# Patient Record
Sex: Female | Born: 1980 | Race: White | Hispanic: No | Marital: Married | Smoking: Never smoker
Health system: Southern US, Community
[De-identification: ages and names within clinical notes are randomized; demographics above are authoritative.]

## PROBLEM LIST (undated history)

## (undated) ENCOUNTER — Inpatient Hospital Stay (HOSPITAL_COMMUNITY): Payer: Self-pay

## (undated) DIAGNOSIS — I83811 Varicose veins of right lower extremities with pain: Secondary | ICD-10-CM

## (undated) HISTORY — DX: Varicose veins of right lower extremity with pain: I83.811

---

## 2016-11-08 ENCOUNTER — Encounter: Payer: Self-pay | Admitting: Internal Medicine

## 2016-11-08 ENCOUNTER — Ambulatory Visit (INDEPENDENT_AMBULATORY_CARE_PROVIDER_SITE_OTHER): Payer: Managed Care, Other (non HMO) | Admitting: Internal Medicine

## 2016-11-08 ENCOUNTER — Other Ambulatory Visit (INDEPENDENT_AMBULATORY_CARE_PROVIDER_SITE_OTHER): Payer: Managed Care, Other (non HMO)

## 2016-11-08 DIAGNOSIS — Z0001 Encounter for general adult medical examination with abnormal findings: Secondary | ICD-10-CM | POA: Diagnosis not present

## 2016-11-08 DIAGNOSIS — Z0000 Encounter for general adult medical examination without abnormal findings: Secondary | ICD-10-CM | POA: Insufficient documentation

## 2016-11-08 DIAGNOSIS — L853 Xerosis cutis: Secondary | ICD-10-CM | POA: Diagnosis not present

## 2016-11-08 LAB — URINALYSIS
Bilirubin Urine: NEGATIVE
Ketones, ur: NEGATIVE
Leukocytes, UA: NEGATIVE
Nitrite: NEGATIVE
SPECIFIC GRAVITY, URINE: 1.01 (ref 1.000–1.030)
Total Protein, Urine: NEGATIVE
UROBILINOGEN UA: 0.2 (ref 0.0–1.0)
Urine Glucose: NEGATIVE
pH: 8 (ref 5.0–8.0)

## 2016-11-08 LAB — HEPATIC FUNCTION PANEL
ALT: 13 U/L (ref 0–35)
AST: 13 U/L (ref 0–37)
Albumin: 4.5 g/dL (ref 3.5–5.2)
Alkaline Phosphatase: 28 U/L — ABNORMAL LOW (ref 39–117)
BILIRUBIN TOTAL: 0.6 mg/dL (ref 0.2–1.2)
Bilirubin, Direct: 0.1 mg/dL (ref 0.0–0.3)
TOTAL PROTEIN: 7.8 g/dL (ref 6.0–8.3)

## 2016-11-08 LAB — LIPID PANEL
CHOLESTEROL: 199 mg/dL (ref 0–200)
HDL: 53.5 mg/dL (ref >39.00)
LDL CALC: 128 mg/dL — AB (ref 0–99)
NonHDL: 145.15
TRIGLYCERIDES: 85 mg/dL (ref 0.0–149.0)
Total CHOL/HDL Ratio: 4
VLDL: 17 mg/dL (ref 0.0–40.0)

## 2016-11-08 LAB — BASIC METABOLIC PANEL WITH GFR
BUN: 9 mg/dL (ref 6–23)
CO2: 27 meq/L (ref 19–32)
Calcium: 9.4 mg/dL (ref 8.4–10.5)
Chloride: 106 meq/L (ref 96–112)
Creatinine, Ser: 0.6 mg/dL (ref 0.40–1.20)
GFR: 121.63 mL/min
Glucose, Bld: 98 mg/dL (ref 70–99)
Potassium: 4.3 meq/L (ref 3.5–5.1)
Sodium: 139 meq/L (ref 135–145)

## 2016-11-08 LAB — CBC WITH DIFFERENTIAL/PLATELET
BASOS ABS: 0 10*3/uL (ref 0.0–0.1)
Basophils Relative: 0.5 % (ref 0.0–3.0)
EOS PCT: 0.9 % (ref 0.0–5.0)
Eosinophils Absolute: 0.1 10*3/uL (ref 0.0–0.7)
HCT: 41.6 % (ref 36.0–46.0)
HEMOGLOBIN: 14.7 g/dL (ref 12.0–15.0)
LYMPHS ABS: 2 10*3/uL (ref 0.7–4.0)
Lymphocytes Relative: 29.8 % (ref 12.0–46.0)
MCHC: 35.3 g/dL (ref 30.0–36.0)
MCV: 84.4 fl (ref 78.0–100.0)
MONO ABS: 0.6 10*3/uL (ref 0.1–1.0)
MONOS PCT: 9.4 % (ref 3.0–12.0)
NEUTROS PCT: 59.4 % (ref 43.0–77.0)
Neutro Abs: 3.9 10*3/uL (ref 1.4–7.7)
Platelets: 244 10*3/uL (ref 150.0–400.0)
RBC: 4.92 Mil/uL (ref 3.87–5.11)
RDW: 12.7 % (ref 11.5–15.5)
WBC: 6.6 10*3/uL (ref 4.0–10.5)

## 2016-11-08 LAB — TSH: TSH: 1.76 u[IU]/mL (ref 0.35–4.50)

## 2016-11-08 MED ORDER — VITAMIN D 1000 UNITS PO TABS: 1000.0000 [IU] | ORAL_TABLET | Freq: Every day | ORAL | 3 refills | Status: AC

## 2016-11-08 MED ORDER — TRIAMCINOLONE ACETONIDE 0.5 % EX OINT: 1.0000 "application " | TOPICAL_OINTMENT | Freq: Two times a day (BID) | CUTANEOUS | 2 refills | Status: AC

## 2016-11-08 MED ORDER — FOLIC ACID 400 MCG PO TABS: 400.0000 ug | ORAL_TABLET | Freq: Every day | ORAL | 3 refills | Status: DC

## 2016-11-08 NOTE — Patient Instructions (Signed)
Sebamed 

## 2016-11-08 NOTE — Progress Notes (Signed)
   Subjective:  Patient ID: Sonya [name], female    DOB: [birth year]  Age: 36 y.o. MRN: [id number]  CC: Establish Care   HPI Sonya [name] presents for a well exam  No outpatient prescriptions prior to visit.   No facility-administered medications prior to visit.     ROS Review of Systems  Constitutional: Positive for unexpected weight change. Negative for activity change, appetite change, chills and fatigue.  HENT: Negative for congestion, mouth sores and sinus pressure.   Eyes: Negative for visual disturbance.  Respiratory: Negative for cough and chest tightness.   Gastrointestinal: Negative for abdominal pain and nausea.  Genitourinary: Negative for difficulty urinating, frequency and vaginal pain.  Musculoskeletal: Negative for back pain and gait problem.  Skin: Positive for rash. Negative for pallor.  Neurological: Negative for dizziness, tremors, weakness, numbness and headaches.  Psychiatric/Behavioral: Negative for confusion and sleep disturbance.  dry skin - fingers  Objective:  BP 110/76   Pulse 77   Ht 5\' 10"  (1.778 m)   Wt 173 lb (78.5 kg)   SpO2 97%   BMI 24.82 kg/m   BP Readings from Last 3 Encounters:  11/08/16 110/76    Wt Readings from Last 3 Encounters:  11/08/16 173 lb (78.5 kg)    Physical Exam  Constitutional: She appears well-developed. No distress.  HENT:  Head: Normocephalic.  Right Ear: External ear normal.  Left Ear: External ear normal.  Nose: Nose normal.  Mouth/Throat: Oropharynx is clear and moist.  Eyes: Conjunctivae are normal. Pupils are equal, round, and reactive to light. Right eye exhibits no discharge. Left eye exhibits no discharge.  Neck: Normal range of motion. Neck supple. No JVD present. No tracheal deviation present. No thyromegaly present.  Cardiovascular: Normal rate, regular rhythm and normal heart sounds.   Pulmonary/Chest: No stridor. No respiratory distress. She has no wheezes.  Abdominal: Soft. Bowel  sounds are normal. She exhibits no distension and no mass. There is no tenderness. There is no rebound and no guarding.  Musculoskeletal: She exhibits no edema or tenderness.  Lymphadenopathy:    She has no cervical adenopathy.  Neurological: She displays normal reflexes. No cranial nerve deficit. She exhibits normal muscle tone. Coordination normal.  Skin: Rash noted. No erythema.  Psychiatric: She has a normal mood and affect. Her behavior is normal. Judgment and thought content normal.  dry skin fingers  No results found for: WBC, HGB, HCT, PLT, GLUCOSE, CHOL, TRIG, HDL, LDLDIRECT, LDLCALC, ALT, AST, NA, K, CL, CREATININE, BUN, CO2, TSH, PSA, INR, GLUF, HGBA1C, MICROALBUR  Patient was never admitted.  Assessment & Plan:   There are no diagnoses linked to this encounter. [name] does not currently have medications on file.  No orders of the defined types were placed in this encounter.    Follow-up: No Follow-up on file.  Sonda PrimesAlex Plotnikov, MD

## 2016-11-08 NOTE — Progress Notes (Signed)
Pre visit review using our clinic review tool, if applicable. No additional management support is needed unless otherwise documented below in the visit note. 

## 2016-11-08 NOTE — Assessment & Plan Note (Addendum)
GYN - pending w/the fertility clinic Labs ordered XRT exposure from [place] (?degree) - they will research it Vit D, folic acid Low carb diet/exercise  We discussed age appropriate health related issues, including available/recomended screening tests and vaccinations. We discussed a need for adhering to healthy diet and exercise. Labs/EKG were reviewed/ordered. All questions were answered.

## 2016-11-08 NOTE — Assessment & Plan Note (Signed)
Wintertime - fingers Sebamed soap Kenalog prn

## 2016-11-09 ENCOUNTER — Encounter: Payer: Self-pay | Admitting: Internal Medicine

## 2016-11-16 ENCOUNTER — Encounter: Payer: Self-pay | Admitting: Internal Medicine

## 2016-11-19 ENCOUNTER — Other Ambulatory Visit: Payer: Self-pay | Admitting: Internal Medicine

## 2016-11-19 MED ORDER — OSELTAMIVIR PHOSPHATE 75 MG PO CAPS
75.0000 mg | ORAL_CAPSULE | Freq: Two times a day (BID) | ORAL | 0 refills | Status: DC
Start: 2016-11-19 — End: 2017-11-14

## 2017-10-15 NOTE — L&D Delivery Note (Signed)
Delivery Note At 1:50 AM a viable female was delivered via Vaginal, Spontaneous (Presentation:ROA).  APGAR: 8, 9; weight pending   Placenta status: complete. Cord: 3V with the following complications: cord avulsion with manual removal of detached placenta from upper vagina.  Cord pH: n/a  Anesthesia:  epidural Episiotomy: None Lacerations: Stellate 2nd degree vaginal  Suture Repair: 2.0 3.0 vicryl Est. Blood Loss (mL):  pending  Mom to postpartum.  Baby to Couplet care / Skin to Skin.  Sonya [name] 08/25/2018, 2:52 AM

## 2017-11-14 ENCOUNTER — Ambulatory Visit (INDEPENDENT_AMBULATORY_CARE_PROVIDER_SITE_OTHER): Payer: Managed Care, Other (non HMO) | Admitting: Internal Medicine

## 2017-11-14 ENCOUNTER — Encounter: Payer: Self-pay | Admitting: Internal Medicine

## 2017-11-14 ENCOUNTER — Other Ambulatory Visit (INDEPENDENT_AMBULATORY_CARE_PROVIDER_SITE_OTHER): Payer: Managed Care, Other (non HMO)

## 2017-11-14 VITALS — BP 112/64 | HR 72 | Temp 98.8°F | Ht 70.0 in | Wt 175.0 lb

## 2017-11-14 DIAGNOSIS — Z0000 Encounter for general adult medical examination without abnormal findings: Secondary | ICD-10-CM

## 2017-11-14 DIAGNOSIS — B351 Tinea unguium: Secondary | ICD-10-CM | POA: Insufficient documentation

## 2017-11-14 DIAGNOSIS — Z3169 Encounter for other general counseling and advice on procreation: Secondary | ICD-10-CM | POA: Insufficient documentation

## 2017-11-14 DIAGNOSIS — L853 Xerosis cutis: Secondary | ICD-10-CM

## 2017-11-14 LAB — URINALYSIS, ROUTINE W REFLEX MICROSCOPIC
BILIRUBIN URINE: NEGATIVE
Ketones, ur: NEGATIVE
LEUKOCYTES UA: NEGATIVE
Nitrite: NEGATIVE
PH: 7.5 (ref 5.0–8.0)
Specific Gravity, Urine: 1.02 (ref 1.000–1.030)
TOTAL PROTEIN, URINE-UPE24: NEGATIVE
URINE GLUCOSE: NEGATIVE
UROBILINOGEN UA: 0.2 (ref 0.0–1.0)
WBC, UA: NONE SEEN (ref 0–?)

## 2017-11-14 LAB — CBC WITH DIFFERENTIAL/PLATELET
Basophils Absolute: 0 10*3/uL (ref 0.0–0.1)
Basophils Relative: 0.4 % (ref 0.0–3.0)
EOS ABS: 0.1 10*3/uL (ref 0.0–0.7)
EOS PCT: 0.9 % (ref 0.0–5.0)
HCT: 42.1 % (ref 36.0–46.0)
Hemoglobin: 14.4 g/dL (ref 12.0–15.0)
LYMPHS ABS: 2 10*3/uL (ref 0.7–4.0)
Lymphocytes Relative: 26.2 % (ref 12.0–46.0)
MCHC: 34.3 g/dL (ref 30.0–36.0)
MCV: 84.8 fl (ref 78.0–100.0)
MONO ABS: 0.6 10*3/uL (ref 0.1–1.0)
Monocytes Relative: 7.7 % (ref 3.0–12.0)
NEUTROS ABS: 4.9 10*3/uL (ref 1.4–7.7)
NEUTROS PCT: 64.8 % (ref 43.0–77.0)
PLATELETS: 239 10*3/uL (ref 150.0–400.0)
RBC: 4.97 Mil/uL (ref 3.87–5.11)
RDW: 12.9 % (ref 11.5–15.5)
WBC: 7.5 10*3/uL (ref 4.0–10.5)

## 2017-11-14 LAB — HEPATIC FUNCTION PANEL
ALK PHOS: 31 U/L — AB (ref 39–117)
ALT: 13 U/L (ref 0–35)
AST: 14 U/L (ref 0–37)
Albumin: 4.3 g/dL (ref 3.5–5.2)
Bilirubin, Direct: 0.1 mg/dL (ref 0.0–0.3)
Total Bilirubin: 0.7 mg/dL (ref 0.2–1.2)
Total Protein: 7.4 g/dL (ref 6.0–8.3)

## 2017-11-14 LAB — LIPID PANEL
CHOLESTEROL: 183 mg/dL (ref 0–200)
HDL: 52.1 mg/dL (ref >39.00)
LDL Cholesterol: 117 mg/dL — ABNORMAL HIGH (ref 0–99)
NonHDL: 130.94
Total CHOL/HDL Ratio: 4
Triglycerides: 71 mg/dL (ref 0.0–149.0)
VLDL: 14.2 mg/dL (ref 0.0–40.0)

## 2017-11-14 LAB — BASIC METABOLIC PANEL
BUN: 11 mg/dL (ref 6–23)
CALCIUM: 9.3 mg/dL (ref 8.4–10.5)
CO2: 30 mEq/L (ref 19–32)
Chloride: 103 mEq/L (ref 96–112)
Creatinine, Ser: 0.58 mg/dL (ref 0.40–1.20)
GFR: 125.72 mL/min (ref >60.00)
GLUCOSE: 99 mg/dL (ref 70–99)
POTASSIUM: 4.4 meq/L (ref 3.5–5.1)
Sodium: 139 mEq/L (ref 135–145)

## 2017-11-14 LAB — TSH: TSH: 1.49 u[IU]/mL (ref 0.35–4.50)

## 2017-11-14 LAB — IBC PANEL
IRON: 131 ug/dL (ref 42–145)
Saturation Ratios: 36.4 % (ref 20.0–50.0)
Transferrin: 257 mg/dL (ref 212.0–360.0)

## 2017-11-14 LAB — IRON: IRON: 131 ug/dL (ref 42–145)

## 2017-11-14 LAB — FERRITIN: FERRITIN: 17.5 ng/mL (ref 10.0–291.0)

## 2017-11-14 LAB — VITAMIN B12: VITAMIN B 12: 1062 pg/mL — AB (ref 211–911)

## 2017-11-14 MED ORDER — TRIAMCINOLONE 0.1 % CREAM:EUCERIN CREAM 1:1: 1.0000 "application " | TOPICAL_CREAM | Freq: Two times a day (BID) | CUTANEOUS | 3 refills | Status: DC | PRN

## 2017-11-14 MED ORDER — CICLOPIROX 8 % EX SOLN: Freq: Every day | CUTANEOUS | 0 refills | Status: DC

## 2017-11-14 NOTE — Assessment & Plan Note (Signed)
[place] qd 

## 2017-11-14 NOTE — Progress Notes (Signed)
Subjective:  Patient ID: Sonya [name], female    DOB: 1980-12-17  Age: 37 y.o. MRN: [id number]  CC: No chief complaint on file.   HPI Sonya [name] presents for dry skin  Outpatient Medications Prior to Visit  Medication Sig Dispense Refill  . folic acid (V-R FOLIC ACID) 400 MCG tablet Take 1 tablet (400 mcg total) by mouth daily. 100 tablet 3  . oseltamivir (TAMIFLU) 75 MG capsule Take 1 capsule (75 mg total) by mouth 2 (two) times daily. 10 capsule 0   No facility-administered medications prior to visit.     ROS Review of Systems  Constitutional: Negative for activity change, appetite change, chills, fatigue and unexpected weight change.  HENT: Negative for congestion, mouth sores and sinus pressure.   Eyes: Negative for visual disturbance.  Respiratory: Negative for cough and chest tightness.   Gastrointestinal: Negative for abdominal pain and nausea.  Genitourinary: Negative for difficulty urinating, frequency and vaginal pain.  Musculoskeletal: Negative for back pain and gait problem.  Skin: Negative for pallor and rash.  Neurological: Negative for dizziness, tremors, weakness, numbness and headaches.  Psychiatric/Behavioral: Negative for confusion and sleep disturbance.    Objective:  BP 112/64 (BP Location: Left Arm, Patient Position: Sitting, Cuff Size: Large)   Pulse 72   Temp 98.8 F (37.1 C) (Oral)   Ht 5\' 10"  (1.778 m)   Wt 175 lb (79.4 kg)   SpO2 99%   BMI 25.11 kg/m   BP Readings from Last 3 Encounters:  11/14/17 112/64  11/08/16 110/76    Wt Readings from Last 3 Encounters:  11/14/17 175 lb (79.4 kg)  11/08/16 173 lb (78.5 kg)    Physical Exam  Constitutional: She appears well-developed. No distress.  HENT:  Head: Normocephalic.  Right Ear: External ear normal.  Left Ear: External ear normal.  Nose: Nose normal.  Mouth/Throat: Oropharynx is clear and moist.  Eyes: Conjunctivae are normal. Pupils are equal, round, and reactive to  light. Right eye exhibits no discharge. Left eye exhibits no discharge.  Neck: Normal range of motion. Neck supple. No JVD present. No tracheal deviation present. No thyromegaly present.  Cardiovascular: Normal rate, regular rhythm and normal heart sounds.  Pulmonary/Chest: No stridor. No respiratory distress. She has no wheezes.  Abdominal: Soft. Bowel sounds are normal. She exhibits no distension and no mass. There is no tenderness. There is no rebound and no guarding.  Musculoskeletal: She exhibits no edema or tenderness.  Lymphadenopathy:    She has no cervical adenopathy.  Neurological: She displays normal reflexes. No cranial nerve deficit. She exhibits normal muscle tone. Coordination normal.  Skin: No rash noted. No erythema.  Psychiatric: She has a normal mood and affect. Her behavior is normal. Judgment and thought content normal.   R big toe fungus Lab Results  Component Value Date   WBC 6.6 11/08/2016   HGB 14.7 11/08/2016   HCT 41.6 11/08/2016   PLT 244.0 11/08/2016   GLUCOSE 98 11/08/2016   CHOL 199 11/08/2016   TRIG 85.0 11/08/2016   HDL 53.50 11/08/2016   LDLCALC 128 (H) 11/08/2016   ALT 13 11/08/2016   AST 13 11/08/2016   NA 139 11/08/2016   K 4.3 11/08/2016   CL 106 11/08/2016   CREATININE 0.60 11/08/2016   BUN 9 11/08/2016   CO2 27 11/08/2016   TSH 1.76 11/08/2016    Patient was never admitted.  Assessment & Plan:   There are no diagnoses linked to this encounter. I have discontinued  Volha [id number] oseltamivir. I am also having her maintain her folic acid.  No orders of the defined types were placed in this encounter.    Follow-up: No Follow-up on file.  Sonda Primes, MD

## 2017-11-14 NOTE — Assessment & Plan Note (Signed)
Problems conceiving with nl prior w/up GYN ref Folic acid

## 2017-11-14 NOTE — Patient Instructions (Signed)
Nitril gloves Sebamed soap

## 2017-11-14 NOTE — Assessment & Plan Note (Addendum)
We discussed age appropriate health related issues, including available/recomended screening tests and vaccinations. We discussed a need for adhering to healthy diet and exercise. Labs/EKG were reviewed/ordered. All questions were answered.   GYNref Labs ordered XRT exposure from [place] (?degree) - they will research it Vit D, folic acid GYN - was seen at fertility clinic before - all nl

## 2017-11-18 ENCOUNTER — Telehealth: Payer: Self-pay | Admitting: Internal Medicine

## 2017-11-18 NOTE — Telephone Encounter (Signed)
Sorry - it was 1:1 Thx

## 2017-11-18 NOTE — Telephone Encounter (Signed)
Summer at pharmacy is calling to get the ratio of Triamcinolone to Eucerin cream is was not included in the Rx and they need that to get the proper mix. Please call to let them know.  [phone]

## 2017-11-19 NOTE — Telephone Encounter (Signed)
Notified [facility] spoke w/Kim gave MD response.Sonya [name].Raechel Chute/lmb

## 2017-11-20 ENCOUNTER — Encounter: Payer: Self-pay | Admitting: Internal Medicine

## 2018-01-06 ENCOUNTER — Ambulatory Visit: Payer: Self-pay | Admitting: Gynecology

## 2018-01-09 LAB — OB RESULTS CONSOLE RPR: RPR: NONREACTIVE

## 2018-01-09 LAB — OB RESULTS CONSOLE RUBELLA ANTIBODY, IGM: RUBELLA: IMMUNE

## 2018-01-09 LAB — OB RESULTS CONSOLE HIV ANTIBODY (ROUTINE TESTING): HIV: NONREACTIVE

## 2018-01-09 LAB — OB RESULTS CONSOLE HEPATITIS B SURFACE ANTIGEN: HEP B S AG: NEGATIVE

## 2018-01-29 LAB — OB RESULTS CONSOLE GC/CHLAMYDIA
CHLAMYDIA, DNA PROBE: NEGATIVE
GC PROBE AMP, GENITAL: NEGATIVE

## 2018-04-22 ENCOUNTER — Inpatient Hospital Stay (HOSPITAL_COMMUNITY)
Admission: AD | Admit: 2018-04-22 | Discharge: 2018-04-23 | Disposition: A | Discharge status: Home / Self Care | Payer: Managed Care, Other (non HMO) | Source: Ambulatory Visit | Attending: Obstetrics and Gynecology | Admitting: Obstetrics and Gynecology

## 2018-04-22 ENCOUNTER — Encounter (HOSPITAL_COMMUNITY): Payer: Self-pay

## 2018-04-22 DIAGNOSIS — O4702 False labor before 37 completed weeks of gestation, second trimester: Secondary | ICD-10-CM | POA: Insufficient documentation

## 2018-04-22 DIAGNOSIS — Z3A22 22 weeks gestation of pregnancy: Secondary | ICD-10-CM | POA: Insufficient documentation

## 2018-04-22 DIAGNOSIS — R109 Unspecified abdominal pain: Secondary | ICD-10-CM | POA: Diagnosis present

## 2018-04-22 DIAGNOSIS — O479 False labor, unspecified: Secondary | ICD-10-CM

## 2018-04-22 DIAGNOSIS — N949 Unspecified condition associated with female genital organs and menstrual cycle: Secondary | ICD-10-CM

## 2018-04-22 NOTE — MAU Note (Signed)
Pt reports lower abdominal pain and cramping that started this morning and worsened this evening. Feels like the pain comes every 5 mins. Pt has not taken anything for pain. Pt denies bleeding.

## 2018-04-23 LAB — URINALYSIS, ROUTINE W REFLEX MICROSCOPIC
Bilirubin Urine: NEGATIVE
GLUCOSE, UA: NEGATIVE mg/dL
Ketones, ur: NEGATIVE mg/dL
Leukocytes, UA: NEGATIVE
NITRITE: NEGATIVE
PH: 7 (ref 5.0–8.0)
PROTEIN: NEGATIVE mg/dL
Specific Gravity, Urine: 1.006 (ref 1.005–1.030)

## 2018-04-23 NOTE — MAU Provider Note (Addendum)
Chief Complaint:  Abdominal Pain  [facility].    HPI: Sonya [name] is a 37 y.o. G2P0010 at [redacted]w[redacted]d who presents to maternity admissions reporting at times sharp shooting pain in lateral aspects of lower uterus, pt also endorses having what she feels like is menstrual cramping, pt endorses being in bed all day due to the pain, pt has not taken any meds, movement makes it worse, pt . Pt endorses having a miscarriage recently and pt is worried about the same thing happening. Denies contractions, leakage of fluid or vaginal bleeding. Good fetal movement. Pt denies cp, sob, n, v, d, rashes, fever. Pt denies vaginal discharge, nor dysuria. Pt denies hematuria  Pregnancy Course:   History reviewed. No pertinent past medical history. OB History  Gravida Para Term Preterm AB Living  2       1    SAB TAB Ectopic Multiple Live Births  1            # Outcome Date GA Lbr Len/2nd Weight Sex Delivery Anes PTL Lv  2 Current           1 SAB            History reviewed. No pertinent surgical history. Family History  Problem Relation Age of Onset  . Hypertension Father   . Glaucoma Father    Social History   Tobacco Use  . Smoking status: Never Smoker  . Smokeless tobacco: Never Used  Substance Use Topics  . Alcohol use: No  . Drug use: No   Allergies  Allergen Reactions  . Kefzol [Cefazolin] Other (See Comments)   No medications prior to admission.    I have reviewed patient's Past Medical Hx, Surgical Hx, Family Hx, Social Hx, medications and allergies.   ROS:  Review of Systems  All other systems reviewed and are negative.   Physical Exam   Patient Vitals for the past 24 hrs:  BP Temp Temp src Pulse Resp SpO2  04/23/18 0016 106/70 98.2 F (36.8 C) Oral 82 17 100 %  04/22/18 2329 (!) 133/93 98.8 F (37.1 C) Oral 90 20 100 %   Constitutional: Well-developed, well-nourished female in no acute distress.  Cardiovascular: normal rate Respiratory: normal effort GI: Abd  soft, non-tender, gravid appropriate for gestational age. Pos BS x 4 MS: Extremities nontender, no edema, normal ROM Neurologic: Alert and oriented x 4.  GU: Neg CVAT.  Pelvic: NEFG, physiologic discharge, no blood, cervix clean. Pelvic adequate for labor. No CMT  Dilation: Closed Effacement (%): Thick Cervical Position: Posterior Station: Ballotable Exam by:: Erma Raiche cnm  FHR baseline 130 bpm, Variability: moderate UC:   none, uterine irritability  SVE:   Dilation: Closed Effacement (%): Thick Station: Ballotable Exam by:: [employer]: Results for orders placed or performed during the hospital encounter of 04/22/18 (from the past 24 hour(s))  Urinalysis, Routine w reflex microscopic     Status: Abnormal   Collection Time: 04/22/18 11:53 PM  Result Value Ref Range   Color, Urine STRAW (A) YELLOW   APPearance CLEAR CLEAR   Specific Gravity, Urine 1.006 1.005 - 1.030   pH 7.0 5.0 - 8.0   Glucose, UA NEGATIVE NEGATIVE mg/dL   Hgb urine dipstick SMALL (A) NEGATIVE   Bilirubin Urine NEGATIVE NEGATIVE   Ketones, ur NEGATIVE NEGATIVE mg/dL   Protein, ur NEGATIVE NEGATIVE mg/dL   Nitrite NEGATIVE NEGATIVE   Leukocytes, UA NEGATIVE NEGATIVE   RBC / HPF 0-5 0 - 5 RBC/hpf  WBC, UA 0-5 0 - 5 WBC/hpf   Bacteria, UA RARE (A) NONE SEEN   Squamous Epithelial / LPF 0-5 0 - 5   Mucus PRESENT     Imaging:  No results found.  MAU Course: Orders Placed This Encounter  Procedures  . Urine culture  . Urinalysis, Routine w reflex microscopic  . Diet - low sodium heart healthy  . Increase activity slowly  . Call MD for:  . Call MD for:  temperature >100.4  . Call MD for:  severe uncontrolled pain  . Call MD for:  persistant nausea and vomiting  . Call MD for:  redness, tenderness, or signs of infection (pain, swelling, redness, odor or green/yellow discharge around incision site)  . Call MD for:  difficulty breathing, headache or visual disturbances  . Call MD for:  hives  . Call MD  for:  persistant dizziness or light-headedness  . Call MD for:  extreme fatigue  . Discharge patient Discharge disposition: 01-Home or Self Care; Discharge patient date: 04/23/2018   No orders of the defined types were placed in this encounter.   MDM: US, UC, FHT, dx with braxton hicks and round ligament pain, discharged home.   Assessment: 1. Round ligament pain   2. Braxton Hick's contraction     Plan: Discharge home in stable condition.  Pending UC: will treat if positive.  Hydrate Use warm bath or heating pad for round ligament pains. May take tylenol PRN Report any vaginal bleeding with cramps to the MAU.  Follow-up Information    Gynecology, [facility] up in 2 week(s).   Specialty:  Obstetrics and Gynecology Contact information: [address] [place] [address] [phone]           Allergies as of 04/23/2018      Reactions   Kefzol [cefazolin] Other (See Comments)      Medication List    TAKE these medications   ciclopirox 8 % solution Commonly known as:  PENLAC Apply topically at bedtime. Apply over nail and surrounding skin. Apply daily over previous coat. After seven (7) days, may remove with alcohol and continue cycle.   folic acid 400 MCG tablet Commonly known as:  V-R FOLIC ACID Take 1 tablet (400 mcg total) by mouth daily.   triamcinolone 0.1 % cream : eucerin Crea Apply 1 application topically 2 (two) times daily as needed.       [facility] Center[facility] Illyanna Petillo NP-C, CNM [place], [place], [place] 04/23/2018 2:16 Reita ClicheAMdill

## 2018-04-24 LAB — URINE CULTURE: Culture: NO GROWTH

## 2018-08-08 LAB — OB RESULTS CONSOLE GBS: GBS: NEGATIVE

## 2018-08-24 ENCOUNTER — Other Ambulatory Visit: Payer: Self-pay

## 2018-08-24 ENCOUNTER — Encounter (HOSPITAL_COMMUNITY): Payer: Self-pay

## 2018-08-24 ENCOUNTER — Inpatient Hospital Stay (HOSPITAL_COMMUNITY): Payer: Managed Care, Other (non HMO) | Admitting: Anesthesiology

## 2018-08-24 ENCOUNTER — Inpatient Hospital Stay (HOSPITAL_COMMUNITY)
Admission: AD | Admit: 2018-08-24 | Discharge: 2018-08-27 | DRG: 807 | Disposition: A | Discharge status: Home / Self Care | Payer: Managed Care, Other (non HMO) | Attending: Obstetrics & Gynecology | Admitting: Obstetrics & Gynecology

## 2018-08-24 DIAGNOSIS — Z3403 Encounter for supervision of normal first pregnancy, third trimester: Secondary | ICD-10-CM

## 2018-08-24 DIAGNOSIS — O26893 Other specified pregnancy related conditions, third trimester: Secondary | ICD-10-CM

## 2018-08-24 DIAGNOSIS — Z3A39 39 weeks gestation of pregnancy: Secondary | ICD-10-CM | POA: Diagnosis not present

## 2018-08-24 DIAGNOSIS — Z3483 Encounter for supervision of other normal pregnancy, third trimester: Secondary | ICD-10-CM | POA: Diagnosis present

## 2018-08-24 LAB — CBC
HCT: 43 % (ref 36.0–46.0)
Hemoglobin: 14.9 g/dL (ref 12.0–15.0)
MCH: 30.8 pg (ref 26.0–34.0)
MCHC: 34.7 g/dL (ref 30.0–36.0)
MCV: 88.8 fL (ref 80.0–100.0)
PLATELETS: 181 10*3/uL (ref 150–400)
RBC: 4.84 MIL/uL (ref 3.87–5.11)
RDW: 13.8 % (ref 11.5–15.5)
WBC: 13.8 10*3/uL — ABNORMAL HIGH (ref 4.0–10.5)
nRBC: 0 % (ref 0.0–0.2)

## 2018-08-24 LAB — TYPE AND SCREEN
ABO/RH(D): A POS
Antibody Screen: NEGATIVE

## 2018-08-24 LAB — ABO/RH: ABO/RH(D): A POS

## 2018-08-24 MED ORDER — LIDOCAINE HCL (PF) 1 % IJ SOLN
30.0000 mL | INTRAMUSCULAR | Status: DC | PRN
  Filled 2018-08-24: qty 30

## 2018-08-24 MED ORDER — EPHEDRINE 5 MG/ML INJ
10.0000 mg | INTRAVENOUS | Status: DC | PRN
  Filled 2018-08-24: qty 2

## 2018-08-24 MED ORDER — SOD CITRATE-CITRIC ACID 500-334 MG/5ML PO SOLN
30.0000 mL | ORAL | Status: DC | PRN
  Administered 2018-08-24: 30 mL via ORAL
  Filled 2018-08-24: qty 15

## 2018-08-24 MED ORDER — OXYCODONE-ACETAMINOPHEN 5-325 MG PO TABS: 2.0000 | ORAL_TABLET | ORAL | Status: DC | PRN

## 2018-08-24 MED ORDER — BUPIVACAINE HCL (PF) 0.25 % IJ SOLN
INTRAMUSCULAR | Status: DC | PRN
  Administered 2018-08-24: 6 mL via EPIDURAL

## 2018-08-24 MED ORDER — PHENYLEPHRINE 40 MCG/ML (10ML) SYRINGE FOR IV PUSH (FOR BLOOD PRESSURE SUPPORT)
80.0000 ug | PREFILLED_SYRINGE | INTRAVENOUS | Status: DC | PRN
  Filled 2018-08-24: qty 10
  Filled 2018-08-24: qty 5

## 2018-08-24 MED ORDER — OXYTOCIN 40 UNITS IN LACTATED RINGERS INFUSION - SIMPLE MED: 2.5000 [IU]/h | INTRAVENOUS | Status: DC

## 2018-08-24 MED ORDER — PHENYLEPHRINE 40 MCG/ML (10ML) SYRINGE FOR IV PUSH (FOR BLOOD PRESSURE SUPPORT)
80.0000 ug | PREFILLED_SYRINGE | INTRAVENOUS | Status: DC | PRN
  Filled 2018-08-24: qty 5

## 2018-08-24 MED ORDER — ONDANSETRON HCL 4 MG/2ML IJ SOLN: 4.0000 mg | Freq: Four times a day (QID) | INTRAMUSCULAR | Status: DC | PRN

## 2018-08-24 MED ORDER — LACTATED RINGERS IV SOLN
500.0000 mL | Freq: Once | INTRAVENOUS | Status: AC
  Administered 2018-08-24: 500 mL via INTRAVENOUS

## 2018-08-24 MED ORDER — LACTATED RINGERS IV SOLN: 500.0000 mL | INTRAVENOUS | Status: DC | PRN

## 2018-08-24 MED ORDER — TERBUTALINE SULFATE 1 MG/ML IJ SOLN
0.2500 mg | Freq: Once | INTRAMUSCULAR | Status: DC | PRN
  Filled 2018-08-24: qty 1

## 2018-08-24 MED ORDER — LACTATED RINGERS IV SOLN: 500.0000 mL | Freq: Once | INTRAVENOUS | Status: DC

## 2018-08-24 MED ORDER — OXYTOCIN 40 UNITS IN LACTATED RINGERS INFUSION - SIMPLE MED
1.0000 m[IU]/min | INTRAVENOUS | Status: DC
  Administered 2018-08-24: 2 m[IU]/min via INTRAVENOUS
  Filled 2018-08-24: qty 1000

## 2018-08-24 MED ORDER — OXYTOCIN BOLUS FROM INFUSION
500.0000 mL | Freq: Once | INTRAVENOUS | Status: AC
  Administered 2018-08-25: 500 mL via INTRAVENOUS

## 2018-08-24 MED ORDER — LACTATED RINGERS IV SOLN
INTRAVENOUS | Status: DC
  Administered 2018-08-24: 15:00:00 via INTRAVENOUS

## 2018-08-24 MED ORDER — DIPHENHYDRAMINE HCL 50 MG/ML IJ SOLN: 12.5000 mg | INTRAMUSCULAR | Status: DC | PRN

## 2018-08-24 MED ORDER — OXYCODONE-ACETAMINOPHEN 5-325 MG PO TABS: 1.0000 | ORAL_TABLET | ORAL | Status: DC | PRN

## 2018-08-24 MED ORDER — FENTANYL 2.5 MCG/ML BUPIVACAINE 1/10 % EPIDURAL INFUSION (WH - ANES)
14.0000 mL/h | INTRAMUSCULAR | Status: DC | PRN
  Administered 2018-08-24 (×2): 14 mL/h via EPIDURAL
  Filled 2018-08-24 (×2): qty 100

## 2018-08-24 MED ORDER — LIDOCAINE HCL (PF) 1 % IJ SOLN
INTRAMUSCULAR | Status: DC | PRN
  Administered 2018-08-24 (×2): 4 mL via EPIDURAL

## 2018-08-24 MED ORDER — ACETAMINOPHEN 325 MG PO TABS
650.0000 mg | ORAL_TABLET | ORAL | Status: DC | PRN
  Administered 2018-08-24: 650 mg via ORAL
  Filled 2018-08-24: qty 2

## 2018-08-24 NOTE — Anesthesia Procedure Notes (Signed)
Epidural Patient location during procedure: OB Start time: 08/24/2018 11:58 AM End time: 08/24/2018 12:01 PM  Staffing Anesthesiologist: Kaylyn Layer, MD Performed: anesthesiologist   Preanesthetic Checklist Completed: patient identified, pre-op evaluation, timeout performed, IV checked, risks and benefits discussed and monitors and equipment checked  Epidural Patient position: sitting Prep: site prepped and draped and DuraPrep Patient monitoring: continuous pulse ox, blood pressure, heart rate and cardiac monitor Approach: midline Location: L3-L4 Injection technique: LOR air  Needle:  Needle type: Tuohy  Needle gauge: 17 G Needle length: 9 cm Needle insertion depth: 5 cm Catheter type: closed end flexible Catheter size: 19 Gauge Catheter at skin depth: 10 cm Test dose: negative and Other (1% lidocaine)  Assessment Sensory level: T8 Events: blood not aspirated, injection not painful, no injection resistance, negative IV test and no paresthesia  Additional Notes Patient identified. Risks, benefits, and alternatives discussed with patient including but not limited to bleeding, infection, nerve damage, paralysis, failed block, incomplete pain control, headache, blood pressure changes, nausea, vomiting, reactions to medication, itching, and postpartum back pain. Confirmed with bedside nurse the patient's most recent platelet count. Confirmed with patient that they are not currently taking any anticoagulation, have any bleeding history, or any family history of bleeding disorders. Patient expressed understanding and wished to proceed. All questions were answered. Sterile technique was used throughout the entire procedure. Please see nursing notes for vital signs. Crisp LOR on first pass. Test dose was given through epidural catheter and negative prior to continuing to dose epidural or start infusion. Warning signs of high block given to the patient including shortness of breath,  tingling/numbness in hands, complete motor block, or any concerning symptoms with instructions to call for help. Patient was given instructions on fall risk and not to get out of bed. All questions and concerns addressed with instructions to call with any issues or inadequate analgesia.  Reason for block:procedure for pain

## 2018-08-24 NOTE — Anesthesia Preprocedure Evaluation (Signed)
Anesthesia Evaluation  Patient identified by MRN, date of birth, ID band Patient awake    Reviewed: Allergy & Precautions, Patient's Chart, lab work & pertinent test results  Airway Mallampati: II  TM Distance: >3 FB Neck ROM: Full    Dental no notable dental hx. (+) Teeth Intact   Pulmonary neg pulmonary ROS,    Pulmonary exam normal breath sounds clear to auscultation       Cardiovascular negative cardio ROS Normal cardiovascular exam Rhythm:Regular Rate:Normal     Neuro/Psych negative neurological ROS  negative psych ROS   GI/Hepatic negative GI ROS, Neg liver ROS,   Endo/Other  negative endocrine ROS  Renal/GU negative Renal ROS  negative genitourinary   Musculoskeletal negative musculoskeletal ROS (+)   Abdominal   Peds negative pediatric ROS (+)  Hematology negative hematology ROS (+)   Anesthesia Other Findings   Reproductive/Obstetrics (+) Pregnancy                             Anesthesia Physical Anesthesia Plan  ASA: II  Anesthesia Plan: Epidural   Post-op Pain Management:    Induction:   PONV Risk Score and Plan: 2 and Treatment may vary due to age or medical condition  Airway Management Planned: Natural Airway  Additional Equipment:   Intra-op Plan:   Post-operative Plan:   Informed Consent: I have reviewed the patients History and Physical, chart, labs and discussed the procedure including the risks, benefits and alternatives for the proposed anesthesia with the patient or authorized representative who has indicated his/her understanding and acceptance.     Plan Discussed with: CRNA  Anesthesia Plan Comments:         Anesthesia Quick Evaluation  

## 2018-08-24 NOTE — Anesthesia Pain Management Evaluation Note (Signed)
  CRNA Pain Management Visit Note  Patient: Sonya [name], 37 y.o., female  "Hello I am a member of the anesthesia team at [facility]. We have an anesthesia team available at all times to provide care throughout the hospital, including epidural management and anesthesia for C-section. I don't know your plan for the delivery whether it a natural birth, water birth, IV sedation, nitrous supplementation, doula or epidural, but we want to meet your pain goals."   1.Was your pain managed to your expectations on prior hospitalizations?   No prior hospitalizations  2.What is your expectation for pain management during this hospitalization?     Epidural  3.How can we help you reach that goal?   Record the patient's initial score and the patient's pain goal.   Pain: 0  Pain Goal: 5 [facility] wants you to be able to say your pain was always managed very well.  Laban Emperor 08/24/2018

## 2018-08-24 NOTE — MAU Provider Note (Signed)
S: Ms. Sonya [name] is a 37 y.o. G2P0010 at [redacted]w[redacted]d  who presents to MAU today complaining of leaking of fluid since 0650. She endorses vaginal bleeding. She endorses contractions. She reports normal fetal movement.    O: BP (!) 134/51   Pulse 79   Temp 98.2 F (36.8 C) (Oral)   Resp 20   SpO2 99%  GENERAL: Well-developed, well-nourished female in no acute distress.  HEAD: Normocephalic, atraumatic.  CHEST: Normal effort of breathing, regular heart rate ABDOMEN: Soft, nontender, gravid PELVIC: Normal external female genitalia. Vagina is pink and rugated. Cervix with normal contour, no lesions. Normal discharge. (+) pooling.   Cervical exam:  Dilation: 4.5 Effacement (%): 80 Cervical Position: Posterior Station: -2 Presentation: Vertex Exam by:: Marvel Plan RN    Fetal Monitoring: Baseline: 125 Variability: moderate Accelerations: 15 x 15 present Decelerations: absent Contractions: regular every 3 mins   (+) Fern   A: [facility] at [redacted]w[redacted]d  SROM  Active Labor  P: Report given to RN to contact CNM on call for admission orders  Raelyn Mora, CNM 08/24/2018, 11:09 AM

## 2018-08-24 NOTE — Progress Notes (Signed)
Subjective: Pt still comfortable, denies pressure  Objective: BP 116/71   Pulse (!) 103   Temp 100.1 F (37.8 C) (Oral)   Resp 18   SpO2 99%  No intake/output data recorded. No intake/output data recorded.  FHT: Category 2 UC:   regular, every 3 minutes SVE:   Dilation: Lip/rim Effacement (%): 100 Station: 0 Exam by:: Francoise Schaumann, CNM Pitocin at 2 MVUs >200  Assessment:  Active labor at term CAT II EFM IUPC - Adequate Anterior lip  Plan:  Lip persistent even when pushing attempted.  Variable and early decelerations noted, mod variability.  Pt warm to touch, but was under several blankets when I arrived.  Will cool patient and see if changes, may have tylenol.  Reviewed with patient and husband that baby had deeper variables with pushing and we will sit her upright and evaluate in 2 hours.  If not changed, would recommend LTCS due to labor arrest.  EFW based on lepolds/33wk u/s = 9lbs.    Altamese Cabal CNM, MSN 08/24/2018, 9:00 PM

## 2018-08-24 NOTE — Progress Notes (Signed)
Subjective: Comfortable with epidural  Objective: VSS No intake/output data recorded. No intake/output data recorded.  FHT: Category UC:   regular, every 3 minutes SVE:   9/100/-1 Pitocin at n/a MVUs n/a   IUPC PLACED   Assessment:  Active labor at term GBS NEG Reassuring FHR IUPC placed due to lack of descent of vtx  - MVU after placement <200  Plan: Start pitocin augmentation and re-evaluate  In 2-3 hours  Altamese Cabal CNM, MSN 08/24/2018, [phone]

## 2018-08-24 NOTE — H&P (Signed)
Sonya [name] is a 37 y.o. female, G2P1 at 39.4 weeks, presenting for Active Labor w/ ruptured membranes.  Patient Active Problem List   Diagnosis Date Noted  . Pre-conception counseling 11/14/2017  . Onychomycosis 11/14/2017  . Well adult exam 11/08/2016  . Dry skin dermatitis 11/08/2016    History of present pregnancy: Patient entered care at 8 weeks.   EDC of 08/27/2018 was established by LMP/US.   Anatomy scan:  20 weeks, with normal findings and an Posterior placenta.   Additional [place] evaluations:  S>D 33wks, EFW 83%ile.   Significant prenatal events:  None   Last evaluation:  10/31  OB History    Gravida  2   Para      Term      Preterm      AB  1   Living        SAB  1   TAB      Ectopic      Multiple      Live Births             No past medical history on file. No past surgical history on file. Family History: family history includes Glaucoma in her father; Hypertension in her father. Social History:  reports that she has never smoked. She has never used smokeless tobacco. She reports that she does not drink alcohol or use drugs.   Prenatal Transfer Tool  Maternal Diabetes: No Genetic Screening: Normal Maternal Ultrasounds/Referrals: Normal Fetal Ultrasounds or other Referrals:  None Maternal Substance Abuse:  No Significant Maternal Medications:  None Significant Maternal Lab Results: None  TDAP 9/27 Flu unknown  ROS: All ROS neg except otherwise noted.  Allergies  Allergen Reactions  . Kefzol [Cefazolin] Other (See Comments)     Dilation: 4.5 Effacement (%): 80 Station: -2 Exam by:: Marvel Plan RN  Blood pressure (!) 134/51, pulse 79, temperature 98.2 F (36.8 C), temperature source Oral, resp. rate 20, SpO2 99 %.  Chest clear Heart RRR without murmur Abd gravid, NT Pelvic: adequate Ext: neg edema  FHR: Category 1 UCs:  q 3  Prenatal labs: ABO, Rh:  A POS Antibody:  NEG Rubella:   IMMUNE RPR:   N/R HBsAg:    NEG HIV:   NEG GBS:  NEG Sickle cell/Hgb electrophoresis:  WNL GC:  NEG Chlamydia:  NEG Genetic screenings:  NEG QUAD Glucola:  127 Other:   Hgb 15 at NOB, 12.8 at 28 weeks       Assessment/Plan: IUP at 39.4WKS, SROM, ACTIVE LABOR  Plan: Admit to [facility] per consult with Dr. Sallye Ober Routine [facility] orders PLANS EPIDURAL ANTICIPATE NSVD. PCN G for GBS prophylaxis N/A  Rosary Lively, MSN 08/24/2018, 11:01 AM

## 2018-08-24 NOTE — MAU Note (Signed)
Pt states she thinks her water broke 3 hours ago. Reports ctx began around same time

## 2018-08-24 NOTE — Progress Notes (Signed)
Subjective: Pt comfortable after epidural  Objective: BP 116/71   Pulse (!) 103   Temp 100.1 F (37.8 C) (Oral)   Resp 18   SpO2 99%  No intake/output data recorded. No intake/output data recorded.  FHT: Category 1 UC:   regular, every 3 minutes SVE:   Per RN was 6/100/-1 Pitocin at n/a MVUs n/a  Assessment:  Active labor at term Reassuring FHR Comfortable with epidural  Plan: Evaluate in 2-3 hours  Altamese Cabal CNM, MSN 08/24/2018, 1500

## 2018-08-24 NOTE — MAU Note (Signed)
Pt. Ctx q 2-3 min.  Pt moving with each ctx and [place] tracing maternal HR.

## 2018-08-25 ENCOUNTER — Encounter (HOSPITAL_COMMUNITY): Payer: Self-pay

## 2018-08-25 LAB — CBC
HCT: 33.8 % — ABNORMAL LOW (ref 36.0–46.0)
Hemoglobin: 11.8 g/dL — ABNORMAL LOW (ref 12.0–15.0)
MCH: 30.8 pg (ref 26.0–34.0)
MCHC: 34.9 g/dL (ref 30.0–36.0)
MCV: 88.3 fL (ref 80.0–100.0)
Platelets: 162 10*3/uL (ref 150–400)
RBC: 3.83 MIL/uL — ABNORMAL LOW (ref 3.87–5.11)
RDW: 14 % (ref 11.5–15.5)
WBC: 21.3 10*3/uL — ABNORMAL HIGH (ref 4.0–10.5)
nRBC: 0 % (ref 0.0–0.2)

## 2018-08-25 LAB — RPR: RPR Ser Ql: NONREACTIVE

## 2018-08-25 MED ORDER — FERROUS SULFATE 325 (65 FE) MG PO TABS
325.0000 mg | ORAL_TABLET | Freq: Two times a day (BID) | ORAL | Status: DC
  Administered 2018-08-25 – 2018-08-26 (×3): 325 mg via ORAL
  Filled 2018-08-25 (×3): qty 1

## 2018-08-25 MED ORDER — ONDANSETRON HCL 4 MG PO TABS: 4.0000 mg | ORAL_TABLET | ORAL | Status: DC | PRN

## 2018-08-25 MED ORDER — DIBUCAINE 1 % RE OINT: 1.0000 "application " | TOPICAL_OINTMENT | RECTAL | Status: DC | PRN

## 2018-08-25 MED ORDER — DIPHENHYDRAMINE HCL 25 MG PO CAPS: 25.0000 mg | ORAL_CAPSULE | Freq: Four times a day (QID) | ORAL | Status: DC | PRN

## 2018-08-25 MED ORDER — BENZOCAINE-MENTHOL 20-0.5 % EX AERO
1.0000 "application " | INHALATION_SPRAY | CUTANEOUS | Status: DC | PRN
  Administered 2018-08-25: 1 via TOPICAL
  Filled 2018-08-25: qty 56

## 2018-08-25 MED ORDER — METHYLERGONOVINE MALEATE 0.2 MG/ML IJ SOLN: 0.2000 mg | INTRAMUSCULAR | Status: DC | PRN

## 2018-08-25 MED ORDER — ONDANSETRON HCL 4 MG/2ML IJ SOLN: 4.0000 mg | INTRAMUSCULAR | Status: DC | PRN

## 2018-08-25 MED ORDER — WITCH HAZEL-GLYCERIN EX PADS: 1.0000 "application " | MEDICATED_PAD | CUTANEOUS | Status: DC | PRN

## 2018-08-25 MED ORDER — BENZOCAINE-MENTHOL 20-0.5 % EX AERO: 1.0000 "application " | INHALATION_SPRAY | CUTANEOUS | Status: DC | PRN

## 2018-08-25 MED ORDER — METHYLERGONOVINE MALEATE 0.2 MG PO TABS: 0.2000 mg | ORAL_TABLET | ORAL | Status: DC | PRN

## 2018-08-25 MED ORDER — IBUPROFEN 600 MG PO TABS
600.0000 mg | ORAL_TABLET | Freq: Four times a day (QID) | ORAL | Status: DC
  Administered 2018-08-25: 600 mg via ORAL
  Filled 2018-08-25: qty 1

## 2018-08-25 MED ORDER — ZOLPIDEM TARTRATE 5 MG PO TABS: 5.0000 mg | ORAL_TABLET | Freq: Every evening | ORAL | Status: DC | PRN

## 2018-08-25 MED ORDER — TETANUS-DIPHTH-ACELL PERTUSSIS 5-2.5-18.5 LF-MCG/0.5 IM SUSP: 0.5000 mL | Freq: Once | INTRAMUSCULAR | Status: DC

## 2018-08-25 MED ORDER — DOCUSATE SODIUM 100 MG PO CAPS
100.0000 mg | ORAL_CAPSULE | Freq: Two times a day (BID) | ORAL | Status: DC
  Administered 2018-08-25 – 2018-08-26 (×2): 100 mg via ORAL
  Filled 2018-08-25 (×3): qty 1

## 2018-08-25 MED ORDER — ACETAMINOPHEN 325 MG PO TABS: 650.0000 mg | ORAL_TABLET | ORAL | Status: DC | PRN

## 2018-08-25 MED ORDER — PRENATAL MULTIVITAMIN CH
1.0000 | ORAL_TABLET | Freq: Every day | ORAL | Status: DC
  Administered 2018-08-26: 1 via ORAL
  Filled 2018-08-25: qty 1

## 2018-08-25 MED ORDER — SIMETHICONE 80 MG PO CHEW: 80.0000 mg | CHEWABLE_TABLET | ORAL | Status: DC | PRN

## 2018-08-25 MED ORDER — DOCUSATE SODIUM 100 MG PO CAPS: 100.0000 mg | ORAL_CAPSULE | Freq: Two times a day (BID) | ORAL | Status: DC

## 2018-08-25 MED ORDER — OXYCODONE HCL 5 MG PO TABS: 10.0000 mg | ORAL_TABLET | ORAL | Status: DC | PRN

## 2018-08-25 MED ORDER — SENNOSIDES-DOCUSATE SODIUM 8.6-50 MG PO TABS
2.0000 | ORAL_TABLET | ORAL | Status: DC
  Administered 2018-08-25 – 2018-08-26 (×2): 2 via ORAL
  Filled 2018-08-25 (×2): qty 2

## 2018-08-25 MED ORDER — IBUPROFEN 600 MG PO TABS
600.0000 mg | ORAL_TABLET | Freq: Four times a day (QID) | ORAL | Status: DC
  Administered 2018-08-25 (×2): 600 mg via ORAL
  Filled 2018-08-25 (×2): qty 1

## 2018-08-25 MED ORDER — IBUPROFEN 600 MG PO TABS
600.0000 mg | ORAL_TABLET | Freq: Four times a day (QID) | ORAL | Status: DC
  Administered 2018-08-25 – 2018-08-27 (×6): 600 mg via ORAL
  Filled 2018-08-25 (×6): qty 1

## 2018-08-25 MED ORDER — COCONUT OIL OIL
1.0000 "application " | TOPICAL_OIL | Status: DC | PRN
  Administered 2018-08-26: 1 via TOPICAL
  Filled 2018-08-25: qty 120

## 2018-08-25 MED ORDER — IBUPROFEN 600 MG PO TABS: 600.0000 mg | ORAL_TABLET | Freq: Four times a day (QID) | ORAL | Status: DC

## 2018-08-25 MED ORDER — COCONUT OIL OIL: 1.0000 "application " | TOPICAL_OIL | Status: DC | PRN

## 2018-08-25 MED ORDER — PRENATAL MULTIVITAMIN CH
1.0000 | ORAL_TABLET | Freq: Every day | ORAL | Status: DC
  Administered 2018-08-25: 1 via ORAL
  Filled 2018-08-25: qty 1

## 2018-08-25 MED ORDER — PRENATAL MULTIVITAMIN CH: 1.0000 | ORAL_TABLET | Freq: Every day | ORAL | Status: DC

## 2018-08-25 MED ORDER — OXYCODONE HCL 5 MG PO TABS: 5.0000 mg | ORAL_TABLET | ORAL | Status: DC | PRN

## 2018-08-25 NOTE — Lactation Note (Signed)
This note was copied from a baby's chart. Lactation Consultation Note FOB signed paper to interpret for mom. Baby 4 hrs old. Sleepy, no interest in BF. Mom holding baby STS. Mom has edema to entire body. Breast are full feeling. Areola edema noted, more in Lt. Areola. Breast slightly tender w/compression. Rt. Breast able to hand express and breast massage 2 drops of thick dark brown/green colostrum from Rt. Breast. Unable to obtain colostrum from Lt. Breast.  Mom has everted nipples, are short shaft d/t edema that compress shorter. Reverse pressure slightly helpful.  Shells given to wear today. Hand pump given w/demonstration for pre-pumping prior to latching. Noted baby has recessed chin. Baby has sputum in mouth. LC used suction bulb to clear.  Newborn behavior, feeding habits, STS, I&O, supply and demand discussed. Mom encouraged to feed baby 8-12 times/24 hours and with feeding cues.  Answered the few questions parents had. Encouraged to call for assistance or further questions.  WH/LC brochure given w/resources, support groups and LC services.  Patient Name: Sonya [name] [id number] Date: 08/25/2018 Reason for consult: Initial assessment;1st time breastfeeding   Maternal Data Has patient been taught Hand Expression?: Yes(needs more teaching) Does the patient have breastfeeding experience prior to this delivery?: No  Feeding    LATCH Score Latch: Too sleepy or reluctant, no latch achieved, no sucking elicited.  Audible Swallowing: None  Type of Nipple: Everted at rest and after stimulation  Comfort (Breast/Nipple): Filling, red/small blisters or bruises, mild/mod discomfort(breast full/edema)        Interventions Interventions: Breast feeding basics reviewed;Support pillows;Position options;Skin to skin;Breast massage;Hand express;Shells;Pre-pump if needed;Reverse pressure;Hand pump;Breast compression  Lactation Tools Discussed/Used Tools: Shells;Pump Shell Type:  Inverted Breast pump type: Manual WIC Program: No Pump Review: Setup, frequency, and cleaning;Milk Storage Initiated by:: Peri Jefferson RN IBCLC Date initiated:: 08/25/18   Consult Status Consult Status: Follow-up Date: 08/25/18 Follow-up type: In-patient    Kember Boch, Diamond Nickel 08/25/2018, 6:08 AM

## 2018-08-25 NOTE — Progress Notes (Signed)
Postpartum day #0, NSVD  Subjective Pt without complaints.  Lochia normal.  Pain controlled.  Breast feeding yes  Temp:  [97.8 F (36.6 C)-100.1 F (37.8 C)] 97.8 F (36.6 C) (11/11 1002) Pulse Rate:  [72-108] 76 (11/11 1002) Resp:  [15-18] 18 (11/11 1002) BP: (109-136)/(54-90) 118/74 (11/11 1002) SpO2:  [95 %-98 %] 95 % (11/11 0615)  Gen:  NAD, A&O x 3 Uterine fundus:  Firm, nontender Lochia normal Ext:  +Edema, no calf tenderness bilaterally  EBL 415  CBC    Component Value Date/Time   WBC 21.3 (H) 08/25/2018 0526   RBC 3.83 (L) 08/25/2018 0526   HGB 11.8 (L) 08/25/2018 0526   HCT 33.8 (L) 08/25/2018 0526   PLT 162 08/25/2018 0526   MCV 88.3 08/25/2018 0526   MCH 30.8 08/25/2018 0526   MCHC 34.9 08/25/2018 0526   RDW 14.0 08/25/2018 0526   LYMPHSABS 2.0 11/14/2017 0918   MONOABS 0.6 11/14/2017 0918   EOSABS 0.1 11/14/2017 0918   BASOSABS 0.0 11/14/2017 0918     A/P: S/p SVD doing well. Routine postpartum care. Lactation support. Discharge per Dr. Charlotta Newton.  Geryl Rankins 08/25/2018, 1:47 PM

## 2018-08-25 NOTE — Anesthesia Postprocedure Evaluation (Signed)
Anesthesia Post Note  Patient: Sonya [name]  Procedure(s) Performed: AN AD HOC LABOR EPIDURAL     Patient location during evaluation: Mother Baby Anesthesia Type: Epidural Level of consciousness: awake, awake and alert, oriented and patient cooperative Pain management: pain level controlled Vital Signs Assessment: post-procedure vital signs reviewed and stable Respiratory status: spontaneous breathing Cardiovascular status: blood pressure returned to baseline Postop Assessment: no headache, no backache, epidural receding, patient able to bend at knees, no apparent nausea or vomiting, adequate PO intake and able to ambulate Anesthetic complications: no    Last Vitals:  Vitals:   08/25/18 0425 08/25/18 0615  BP: 117/75 114/71  Pulse: 97 85  Resp: 18 18  Temp: 37.1 C 37.3 C  SpO2: 98% 95%    Last Pain:  Vitals:   08/25/18 0617  TempSrc:   PainSc: 0-No pain   Pain Goal:                 Jennelle Human

## 2018-08-26 LAB — CBC
HCT: 31 % — ABNORMAL LOW (ref 36.0–46.0)
Hemoglobin: 10.4 g/dL — ABNORMAL LOW (ref 12.0–15.0)
MCH: 30.3 pg (ref 26.0–34.0)
MCHC: 33.5 g/dL (ref 30.0–36.0)
MCV: 90.4 fL (ref 80.0–100.0)
Platelets: 158 10*3/uL (ref 150–400)
RBC: 3.43 MIL/uL — ABNORMAL LOW (ref 3.87–5.11)
RDW: 14.1 % (ref 11.5–15.5)
WBC: 13.3 10*3/uL — ABNORMAL HIGH (ref 4.0–10.5)
nRBC: 0 % (ref 0.0–0.2)

## 2018-08-26 NOTE — Lactation Note (Signed)
This note was copied from a baby's chart. Lactation Consultation Note Checked on mom and baby. Room dark, curtain pulled. No noise noted.  LC assuming they are sleeping.  LC feels mom will need f/u today.  Patient Name: Girl Jaclyn ShaggyVolha Mccartt XBJYN'WToday's Date: 08/26/2018 Reason for consult: Mother's request;Difficult latch;1st time breastfeeding   Maternal Data    Feeding Feeding Type: Breast Fed  LATCH Score Latch: Grasps breast easily, tongue down, lips flanged, rhythmical sucking.  Audible Swallowing: None  Type of Nipple: Everted at rest and after stimulation  Comfort (Breast/Nipple): Filling, red/small blisters or bruises, mild/mod discomfort  Hold (Positioning): Assistance needed to correctly position infant at breast and maintain latch.  LATCH Score: 6  Interventions Interventions: Breast feeding basics reviewed;Support pillows;Assisted with latch;Position options;Skin to skin;Breast massage;Hand express;Coconut oil;Shells;Pre-pump if needed;Reverse pressure;Breast compression;Adjust position  Lactation Tools Discussed/Used Tools: Shells;Pump;Coconut oil;Comfort gels;Nipple Shields Nipple shield size: 20;24 Shell Type: Inverted Breast pump type: Manual   Consult Status Consult Status: Follow-up Date: 08/26/18 Follow-up type: In-patient    Eve Rey, Diamond NickelLAURA G 08/26/2018, 5:09 AM

## 2018-08-26 NOTE — Progress Notes (Signed)
Postpartum day #1, NSVD  Subjective Resting comfortably in bed, feeling some vaginal soreness, but otherwise doing well.  Denies F/C/CP/SOB.  Tolerating gen diet. Voiding and ambulating without difficulty.  +flatus, no BM.  No acute complaints  Temp:  [97.8 F (36.6 C)-97.9 F (36.6 C)] 97.8 F (36.6 C) (11/12 0513) Pulse Rate:  [75-90] 75 (11/12 0513) Resp:  [18] 18 (11/12 0513) BP: (103-118)/(56-74) 103/56 (11/12 0513) SpO2:  [97 %-98 %] 98 % (11/12 0513)  Gen:  NAD, A&O x 3 CV: RRR Lungs: CTAB Uterine fundus:  Firm, nontender, below umbilicus Lochia normal Ext:  +Edema, no calf tenderness bilaterally  EBL 415  Labs:  Results for orders placed or performed during the hospital encounter of 08/24/18 (from the past 24 hour(s))  CBC     Status: Abnormal   Collection Time: 08/26/18  5:24 AM  Result Value Ref Range   WBC 13.3 (H) 4.0 - 10.5 K/uL   RBC 3.43 (L) 3.87 - 5.11 MIL/uL   Hemoglobin 10.4 (L) 12.0 - 15.0 g/dL   HCT 13.231.0 (L) 44.036.0 - 10.246.0 %   MCV 90.4 80.0 - 100.0 fL   MCH 30.3 26.0 - 34.0 pg   MCHC 33.5 30.0 - 36.0 g/dL   RDW 72.514.1 36.611.5 - 44.015.5 %   Platelets 158 150 - 400 K/uL   nRBC 0.0 0.0 - 0.2 %    A/P: 37yo G1P1 s/p NSVD, PPD#1 Routine postpartum care. Lactation support. Continue with routine care, plan for discharge home tomorrow on PPD#2  Myna HidalgoJennifer Orvetta Danielski, DO [phone] (cell) [phone] (office)

## 2018-08-26 NOTE — Lactation Note (Signed)
This note was copied from a baby's chart. Lactation Consultation Note  Patient Name: Sonya [name] [id number] Date: 08/26/2018 Reason for consult: Follow-up assessment   FOB is helping interpret [place]. Mother had blister on R nipple that is now healing.  Wearing shells. She is using #24NS and #20NS for pain. Discussed having to pump if using the nipple shield with each feeding. Family stated they want to try without nipple shield with next feeding. Mother knows she can pump w/ manual pump for 10 min per side. Discussed spoon feeding. Suggest to call if they change their mind and want to be set up with DEBP. Mom encouraged to feed baby 8-12 times/24 hours and with feeding cues.     Maternal Data    Feeding Feeding Type: Breast Fed  LATCH Score                   Interventions Interventions: Hand pump  Lactation Tools Discussed/Used Tools: Nipple Shields Nipple shield size: 20;24   Consult Status Consult Status: Follow-up Date: 08/27/18 Follow-up type: In-patient    [name] [facility] 08/26/2018, 3:12 PM

## 2018-08-26 NOTE — Lactation Note (Addendum)
This note was copied from a baby's chart. Lactation Consultation Note Baby 23 hrs old. Poor feeder today. Baby last BF well at 1500. Baby fussing and cueing to BF.  Unable to latch to Lt. Nipple. Breast has edema, areola edema. Reverse pressure w/little help. Breast not very compressible.Mom wearing shells at intervals during the day. Rt. Everted nipple w/verticle positional stripe and blisters. Painful. Mom has coconut oil to nipples. Gave mom comfort gels when has no coconut oil on nipples. Encouraged to wear after BF.  Encouraged football for now. Unable to hand express colostrum at this time.  Fitted mom w/#24 NS to Lt. Nipple. Latched baby. A lot of room in NS. Some pinching noted. Baby has wide flange. Difficulty to get baby to open wider. Fitted mom #20 NS. Mom stated much better. Baby appeared to like it better as well. Taught "C" hold. After feeding baby unlatched. No colostrum noted in NS. No swallows heard.  Taught breast massage during feeding. Discussed newborn feeding habits.  Answered questions, discussed positioning, STS, I&O. Rt. Nipple very tender, slightly larger than the Lt. Nipple and more everted. Encouraged mom to call for [facility] assist to that nipple d/t nipple injury. Taught application of NS several times. Will ask RN to set up DEBP d/t NS. If baby is cluster feeding mom will not be able to pump at this time. FOB interprets for mom.  Patient Name: Sonya [name] [id number] Date: 08/26/2018 Reason for consult: Mother's request;Difficult latch;1st time breastfeeding   Maternal Data    Feeding Feeding Type: Breast Fed  LATCH Score Latch: Grasps breast easily, tongue down, lips flanged, rhythmical sucking.  Audible Swallowing: None  Type of Nipple: Everted at rest and after stimulation  Comfort (Breast/Nipple): Filling, red/small blisters or bruises, mild/mod discomfort  Hold (Positioning): Assistance needed to correctly position infant at breast and maintain  latch.  LATCH Score: 6  Interventions Interventions: Breast feeding basics reviewed;Support pillows;Assisted with latch;Position options;Skin to skin;Breast massage;Hand express;Coconut oil;Shells;Pre-pump if needed;Reverse pressure;Breast compression;Adjust position  Lactation Tools Discussed/Used Tools: Shells;Pump;Coconut oil;Comfort gels;Nipple Shields Nipple shield size: 20;24 Shell Type: Inverted Breast pump type: Manual   Consult Status Consult Status: Follow-up Date: 08/26/18 Follow-up type: In-patient    Keldon Lassen, Diamond Nickel 08/26/2018, 1:14 AM

## 2018-08-27 ENCOUNTER — Inpatient Hospital Stay (HOSPITAL_COMMUNITY): Admit: 2018-08-27 | Payer: Self-pay

## 2018-08-27 ENCOUNTER — Ambulatory Visit: Payer: Self-pay

## 2018-08-27 MED ORDER — DOCUSATE SODIUM 100 MG PO CAPS: 100.0000 mg | ORAL_CAPSULE | Freq: Two times a day (BID) | ORAL | 0 refills | Status: DC

## 2018-08-27 MED ORDER — IBUPROFEN 600 MG PO TABS: 600.0000 mg | ORAL_TABLET | Freq: Four times a day (QID) | ORAL | 0 refills | Status: DC

## 2018-08-27 MED ORDER — FERROUS SULFATE 325 (65 FE) MG PO TABS: 325.0000 mg | ORAL_TABLET | Freq: Every day | ORAL | 3 refills | Status: DC

## 2018-08-27 NOTE — Discharge Summary (Signed)
OB Discharge Summary     Patient Name: Sonya [name] DOB: 09/13/1981 MRN: [id number]  Date of admission: 08/24/2018 Delivering MD: Altamese CabalBAIRD, ERIN M   Date of discharge: 08/27/2018  Admitting diagnosis: RUPTURED CTX Intrauterine pregnancy: 7495w5d     Secondary diagnosis:  Active Problems:   Normal labor  Additional problems: none     Discharge diagnosis: Term Pregnancy Delivered                                                                                                Post partum procedures:n/a  Augmentation: n/a  Complications: None  Hospital course:  Onset of Labor With Vaginal Delivery     37 y.o. yo G2P1011 at 6495w5d was admitted in Latent Labor on 08/24/2018. Patient had an uncomplicated labor course as follows:  Membrane Rupture Time/Date: 6:50 AM ,08/24/2018   Intrapartum Procedures: Episiotomy: None [1]                                         Lacerations:  2nd degree [3];Perineal [11]  Patient had a delivery of a Viable infant. 08/25/2018  Information for the patient's newborn:  Rudene AndaZheldakova, Girl Volha [[id number]][[id number]]  Delivery Method: Vaginal, Spontaneous(Filed from Delivery Summary)    Pateint had an uncomplicated postpartum course.  She is ambulating, tolerating a regular diet, passing flatus, and urinating well. Patient is discharged home in stable condition on 08/27/18.   Physical exam  Vitals:   08/26/18 0513 08/26/18 1342 08/26/18 2242 08/27/18 0540  BP: (!) 103/56 108/67 108/73 120/63  Pulse: 75 71 82 76  Resp: 18 18  20   Temp: 97.8 F (36.6 C) 98 F (36.7 C) 98.2 F (36.8 C) 97.8 F (36.6 C)  TempSrc: Oral Oral Oral Oral  SpO2: 98%  98% 98%   General: alert, cooperative and no distress Lochia: appropriate Uterine Fundus: firm Incision: N/A DVT Evaluation: No evidence of DVT seen on physical exam. Labs: Lab Results  Component Value Date   WBC 13.3 (H) 08/26/2018   HGB 10.4 (L) 08/26/2018   HCT 31.0 (L) 08/26/2018   MCV 90.4 08/26/2018    PLT 158 08/26/2018   CMP Latest Ref Rng & Units 11/14/2017  Glucose 70 - 99 mg/dL 99  BUN 6 - 23 mg/dL 11  Creatinine 0.860.40 - 5.781.20 mg/dL 4.690.58  Sodium 629135 - 528145 mEq/L 139  Potassium 3.5 - 5.1 mEq/L 4.4  Chloride 96 - 112 mEq/L 103  CO2 19 - 32 mEq/L 30  Calcium 8.4 - 10.5 mg/dL 9.3  Total Protein 6.0 - 8.3 g/dL 7.4  Total Bilirubin 0.2 - 1.2 mg/dL 0.7  Alkaline Phos 39 - 117 U/L 31(L)  AST 0 - 37 U/L 14  ALT 0 - 35 U/L 13    Discharge instruction: per After Visit Summary and "Baby and Me Booklet".  After visit meds:  Allergies as of 08/27/2018      Reactions   Kefzol [cefazolin] Other (See Comments)   fever      Medication  List    STOP taking these medications   ciclopirox 8 % solution Commonly known as:  PENLAC   folic acid 400 MCG tablet Commonly known as:  FOLVITE     TAKE these medications   acetaminophen 500 MG tablet Commonly known as:  TYLENOL Take 1,000 mg by mouth every 8 (eight) hours as needed for mild pain or headache.   docusate sodium 100 MG capsule Commonly known as:  COLACE Take 1 capsule (100 mg total) by mouth 2 (two) times daily.   ferrous sulfate 325 (65 FE) MG tablet Take 1 tablet (325 mg total) by mouth daily with breakfast.   ibuprofen 600 MG tablet Commonly known as:  ADVIL,MOTRIN Take 1 tablet (600 mg total) by mouth every 6 (six) hours.   prenatal multivitamin Tabs tablet Take 1 tablet by mouth daily at 12 noon.       Diet: routine diet  Activity: Advance as tolerated. Pelvic rest for 6 weeks.   Outpatient follow up:2-3 weeks Follow up Appt: Future Appointments  Date Time Provider Department Center  11/20/2018  8:10 AM Plotnikov, Georgina Quint, MD [id number]   Follow up Visit:No follow-ups on file.  Postpartum contraception: Not Discussed  Newborn Data: Live born female  Birth Weight: 7 lb 12.2 oz (3521 g) APGAR: 8, 9  Newborn Delivery   Birth date/time:  08/25/2018 01:50:00 Delivery type:  Vaginal, Spontaneous      Baby Feeding: Breast Disposition:rooming in   08/27/2018 Sharon Seller, DO

## 2018-08-27 NOTE — Lactation Note (Signed)
This note was copied from a baby's chart. Lactation Consultation Note:  Infant had a weight loss of 8.3 % weight loss. Parents have  declined to offer supplement. Infant is jaundice and is under photo therapy.  Mother reports that infant bites when breastfeeding. Mother has large positional strip on the Rt nipple.  Mother has been using a Nipple shield with poor latch.  Mother reports that she is seeing colostrum in the nipple shield.  Parents report that infant is cluster feeding. FOB is allowing infant to suckle on his gloved finger. He reports that he thinks infant is getting hungry.   Discussed infants need for additional calories due to jaundice and poor feedings.  Mother was sat up with a DEBP. She pumped for 15 mins and obtained a few drops.   Assist with latching infant on the left breast in football hold.  #5 fr feeding tube placed with 10 ml of formula .  Assist with flanging infants lips . Mother reports pain on the initial latch,but subsided when infant began to swallow.  Infant was given another 5 ml for a total of 15 ml.  Advised mother to continue to post pump and to supplement with ebm/formula . Suggested that mother offer at least 15 ml or more  With each feeding. Suggested to use syringe for finger feeding or #5 fr feeding tube.  [facility] or [facility] assistance when latch infant to breast.  Parents receptive to all teaching. Encouraged to continue to breastfeed 8-12 times in 24 hours.   Patient Name: Girl Jaclyn ShaggyVolha Lacomb MVHQI'OToday's Date: 08/27/2018 Reason for consult: Follow-up assessment   Maternal Data    Feeding Feeding Type: Breast Fed  LATCH Score                   Interventions Interventions: Skin to skin;Breast massage;Hand express;Expressed milk;Coconut oil  Lactation Tools Discussed/Used Tools: Nipple Shields Nipple shield size: 20 Shell Type: Inverted Breast pump type: Double-Electric Breast Pump Pump Review: Setup,  frequency, and cleaning;Milk Storage Initiated by:: Stevan BornSherry Praise Dolecki RN,IBCLC Date initiated:: 08/27/18   Consult Status Consult Status: Follow-up(mother to page after pumping) Date: 08/27/18 Follow-up type: In-patient    Stevan BornKendrick, Kyara Boxer [facility] 08/27/2018, 12:32 PM

## 2018-08-27 NOTE — Progress Notes (Signed)
Discharge teaching done with FOB who signed papers to interpret for mother all questions answered.

## 2018-08-27 NOTE — Discharge Instructions (Signed)
Vaginal Delivery, Care After °Refer to this sheet in the next few weeks. These instructions provide you with information about caring for yourself after vaginal delivery. Your health care provider may also give you more specific instructions. Your treatment has been planned according to current medical practices, but problems sometimes occur. Call your health care provider if you have any problems or questions. °What can I expect after the procedure? °After vaginal delivery, it is common to have: °· Some bleeding from your vagina. °· Soreness in your abdomen, your vagina, and the area of skin between your vaginal opening and your anus (perineum). °· Pelvic cramps. °· Fatigue. ° °Follow these instructions at home: °Medicines °· Take over-the-counter and prescription medicines only as told by your health care provider. °· If you were prescribed an antibiotic medicine, take it as told by your health care provider. Do not stop taking the antibiotic until it is finished. °Driving ° °· Do not drive or operate heavy machinery while taking prescription pain medicine. °· Do not drive for 24 hours if you received a sedative. °Lifestyle °· Do not drink alcohol. This is especially important if you are breastfeeding or taking medicine to relieve pain. °· Do not use tobacco products, including cigarettes, chewing tobacco, or e-cigarettes. If you need help quitting, ask your health care provider. °Eating and drinking °· Drink at least 8 eight-ounce glasses of water every day unless you are told not to by your health care provider. If you choose to breastfeed your baby, you may need to drink more water than this. °· Eat high-fiber foods every day. These foods may help prevent or relieve constipation. High-fiber foods include: °? Whole grain cereals and breads. °? Brown rice. °? Beans. °? Fresh fruits and vegetables. °Activity °· Return to your normal activities as told by your health care provider. Ask your health care provider  what activities are safe for you. °· Rest as much as possible. Try to rest or take a nap when your baby is sleeping. °· Do not lift anything that is heavier than your baby or 10 lb (4.5 kg) until your health care provider says that it is safe. °· Talk with your health care provider about when you can engage in sexual activity. This may depend on your: °? Risk of infection. °? Rate of healing. °? Comfort and desire to engage in sexual activity. °Vaginal Care °· If you have an episiotomy or a vaginal tear, check the area every day for signs of infection. Check for: °? More redness, swelling, or pain. °? More fluid or blood. °? Warmth. °? Pus or a bad smell. °· Do not use tampons or douches until your health care provider says this is safe. °· Watch for any blood clots that may pass from your vagina. These may look like clumps of dark red, brown, or black discharge. °General instructions °· Keep your perineum clean and dry as told by your health care provider. °· Wear loose, comfortable clothing. °· Wipe from front to back when you use the toilet. °· Ask your health care provider if you can shower or take a bath. If you had an episiotomy or a perineal tear during labor and delivery, your health care provider may tell you not to take baths for a certain length of time. °· Wear a bra that supports your breasts and fits you well. °· If possible, have someone help you with household activities and help care for your baby for at least a few days after   you leave the hospital. °· Keep all follow-up visits for you and your baby as told by your health care provider. This is important. °Contact a health care provider if: °· You have: °? Vaginal discharge that has a bad smell. °? Difficulty urinating. °? Pain when urinating. °? A sudden increase or decrease in the frequency of your bowel movements. °? More redness, swelling, or pain around your episiotomy or vaginal tear. °? More fluid or blood coming from your episiotomy or  vaginal tear. °? Pus or a bad smell coming from your episiotomy or vaginal tear. °? A fever. °? A rash. °? Little or no interest in activities you used to enjoy. °? Questions about caring for yourself or your baby. °· Your episiotomy or vaginal tear feels warm to the touch. °· Your episiotomy or vaginal tear is separating or does not appear to be healing. °· Your breasts are painful, hard, or turn red. °· You feel unusually sad or worried. °· You feel nauseous or you vomit. °· You pass large blood clots from your vagina. If you pass a blood clot from your vagina, save it to show to your health care provider. Do not flush blood clots down the toilet without having your health care provider look at them. °· You urinate more than usual. °· You are dizzy or light-headed. °· You have not breastfed at all and you have not had a menstrual period for 12 weeks after delivery. °· You have stopped breastfeeding and you have not had a menstrual period for 12 weeks after you stopped breastfeeding. °Get help right away if: °· You have: °? Pain that does not go away or does not get better with medicine. °? Chest pain. °? Difficulty breathing. °? Blurred vision or spots in your vision. °? Thoughts about hurting yourself or your baby. °· You develop pain in your abdomen or in one of your legs. °· You develop a severe headache. °· You faint. °· You bleed from your vagina so much that you fill two sanitary pads in one hour. °This information is not intended to replace advice given to you by your health care provider. Make sure you discuss any questions you have with your health care provider. °Document Released: 09/28/2000 Document Revised: 03/14/2016 Document Reviewed: 10/16/2015 °Elsevier Interactive Patient Education © 2018 [employer] ° °

## 2018-08-28 ENCOUNTER — Ambulatory Visit: Payer: Self-pay

## 2018-08-28 NOTE — Lactation Note (Signed)
This note was copied from a baby'[name] chart. Lactation Consultation Note  Patient Name: Sonya [name] ZOXWR'UToday'[name] Date: 08/28/2018 Reason for consult: Follow-up assessment;Hyperbilirubinemia Breasts are becoming full but not engorged.  Engorgement prevention and treatment discussed.  Mom has a Spectra pump for home use.  Parents state they feel comfortable using the 5 french/syringe at breast.  Mom is also using a nipple shield.  Parents asking about when to pump.  Mom recently pumped 5 mls.  Instructed to post pump after putting baby to breast.  Baby will be discharged home receiving phototherapy.  Weight loss is at 8%.  Questions answered.  Lactation outpatient services and support information encouraged prn.  Maternal Data    Feeding Feeding Type: Breast Fed  LATCH Score Latch: Grasps breast easily, tongue down, lips flanged, rhythmical sucking.  Audible Swallowing: A few with stimulation  Type of Nipple: Everted at rest and after stimulation  Comfort (Breast/Nipple): Soft / non-tender  Hold (Positioning): Assistance needed to correctly position infant at breast and maintain latch.  LATCH Score: 8  Interventions    Lactation Tools Discussed/Used Tools: Pump;52F feeding tube / Syringe(formula supplement at the breast) Shell Type: Inverted Breast pump type: Double-Electric Breast Pump   Consult Status Consult Status: Complete Follow-up type: Call as needed    Huston FoleyMOULDEN, Sonya [name] 08/28/2018, 11:05 AM

## 2018-11-20 ENCOUNTER — Ambulatory Visit (INDEPENDENT_AMBULATORY_CARE_PROVIDER_SITE_OTHER): Payer: Managed Care, Other (non HMO) | Admitting: Internal Medicine

## 2018-11-20 ENCOUNTER — Encounter: Payer: Self-pay | Admitting: Internal Medicine

## 2018-11-20 ENCOUNTER — Other Ambulatory Visit (INDEPENDENT_AMBULATORY_CARE_PROVIDER_SITE_OTHER): Payer: Managed Care, Other (non HMO)

## 2018-11-20 VITALS — BP 110/66 | HR 67 | Temp 98.4°F | Ht 70.0 in | Wt 203.0 lb

## 2018-11-20 DIAGNOSIS — D649 Anemia, unspecified: Secondary | ICD-10-CM | POA: Insufficient documentation

## 2018-11-20 DIAGNOSIS — D509 Iron deficiency anemia, unspecified: Secondary | ICD-10-CM

## 2018-11-20 DIAGNOSIS — Z0000 Encounter for general adult medical examination without abnormal findings: Secondary | ICD-10-CM

## 2018-11-20 DIAGNOSIS — L600 Ingrowing nail: Secondary | ICD-10-CM | POA: Insufficient documentation

## 2018-11-20 LAB — CBC WITH DIFFERENTIAL/PLATELET
BASOS PCT: 0.3 % (ref 0.0–3.0)
Basophils Absolute: 0 10*3/uL (ref 0.0–0.1)
EOS PCT: 1.4 % (ref 0.0–5.0)
Eosinophils Absolute: 0.1 10*3/uL (ref 0.0–0.7)
HEMATOCRIT: 46.5 % — AB (ref 36.0–46.0)
HEMOGLOBIN: 16 g/dL — AB (ref 12.0–15.0)
Lymphocytes Relative: 28.1 % (ref 12.0–46.0)
Lymphs Abs: 1.9 10*3/uL (ref 0.7–4.0)
MCHC: 34.3 g/dL (ref 30.0–36.0)
MCV: 84.6 fl (ref 78.0–100.0)
MONO ABS: 0.6 10*3/uL (ref 0.1–1.0)
Monocytes Relative: 9 % (ref 3.0–12.0)
NEUTROS ABS: 4.1 10*3/uL (ref 1.4–7.7)
Neutrophils Relative %: 61.2 % (ref 43.0–77.0)
PLATELETS: 234 10*3/uL (ref 150.0–400.0)
RBC: 5.5 Mil/uL — ABNORMAL HIGH (ref 3.87–5.11)
RDW: 13 % (ref 11.5–15.5)
WBC: 6.8 10*3/uL (ref 4.0–10.5)

## 2018-11-20 LAB — BASIC METABOLIC PANEL
BUN: 12 mg/dL (ref 6–23)
CHLORIDE: 102 meq/L (ref 96–112)
CO2: 29 mEq/L (ref 19–32)
Calcium: 9.9 mg/dL (ref 8.4–10.5)
Creatinine, Ser: 0.67 mg/dL (ref 0.40–1.20)
GFR: 99.57 mL/min (ref >60.00)
Glucose, Bld: 88 mg/dL (ref 70–99)
Potassium: 4.2 mEq/L (ref 3.5–5.1)
SODIUM: 139 meq/L (ref 135–145)

## 2018-11-20 LAB — URINALYSIS, ROUTINE W REFLEX MICROSCOPIC
Bilirubin Urine: NEGATIVE
KETONES UR: NEGATIVE
Nitrite: NEGATIVE
PH: 7 (ref 5.0–8.0)
Specific Gravity, Urine: 1.015 (ref 1.000–1.030)
Total Protein, Urine: NEGATIVE
URINE GLUCOSE: NEGATIVE
Urobilinogen, UA: 0.2 (ref 0.0–1.0)

## 2018-11-20 LAB — LIPID PANEL
CHOLESTEROL: 233 mg/dL — AB (ref 0–200)
HDL: 55.5 mg/dL (ref >39.00)
LDL Cholesterol: 155 mg/dL — ABNORMAL HIGH (ref 0–99)
NonHDL: 177.05
TRIGLYCERIDES: 112 mg/dL (ref 0.0–149.0)
Total CHOL/HDL Ratio: 4
VLDL: 22.4 mg/dL (ref 0.0–40.0)

## 2018-11-20 LAB — HEPATIC FUNCTION PANEL
ALBUMIN: 4.6 g/dL (ref 3.5–5.2)
ALT: 18 U/L (ref 0–35)
AST: 12 U/L (ref 0–37)
Alkaline Phosphatase: 52 U/L (ref 39–117)
Bilirubin, Direct: 0.1 mg/dL (ref 0.0–0.3)
TOTAL PROTEIN: 7.9 g/dL (ref 6.0–8.3)
Total Bilirubin: 0.6 mg/dL (ref 0.2–1.2)

## 2018-11-20 LAB — VITAMIN B12: Vitamin B-12: 916 pg/mL — ABNORMAL HIGH (ref 211–911)

## 2018-11-20 LAB — VITAMIN D 25 HYDROXY (VIT D DEFICIENCY, FRACTURES): VITD: 39.82 ng/mL (ref 30.00–100.00)

## 2018-11-20 LAB — TSH: TSH: 0.96 u[IU]/mL (ref 0.35–4.50)

## 2018-11-20 NOTE — Assessment & Plan Note (Signed)
We discussed age appropriate health related issues, including available/recomended screening tests and vaccinations. We discussed a need for adhering to healthy diet and exercise. Labs were ordered to be later reviewed . All questions were answered.   

## 2018-11-20 NOTE — Assessment & Plan Note (Signed)
L3d toe ingr toenail - a wedge was cut off Pt refused abx abx oint/soaks Podiatry if needed

## 2018-11-20 NOTE — Assessment & Plan Note (Signed)
post-partum Labs

## 2018-11-20 NOTE — Progress Notes (Signed)
Subjective:  Patient ID: Sonya [name], female    DOB: 05-Jul-1981  Age: 38 y.o. MRN: [id number]  CC: No chief complaint on file.   HPI Sonya [name] presents for well exam 3 mo old dtr - breast feeding C/o dizziness  Outpatient Medications Prior to Visit  Medication Sig Dispense Refill  . acetaminophen (TYLENOL) 500 MG tablet Take 1,000 mg by mouth every 8 (eight) hours as needed for mild pain or headache.    . docusate sodium (COLACE) 100 MG capsule Take 1 capsule (100 mg total) by mouth 2 (two) times daily. 10 capsule 0  . ferrous sulfate 325 (65 FE) MG tablet Take 1 tablet (325 mg total) by mouth daily with breakfast. 30 tablet 3  . ibuprofen (ADVIL,MOTRIN) 600 MG tablet Take 1 tablet (600 mg total) by mouth every 6 (six) hours. 30 tablet 0  . Prenatal Vit-Fe Fumarate-FA (PRENATAL MULTIVITAMIN) TABS tablet Take 1 tablet by mouth daily at 12 noon.     No facility-administered medications prior to visit.     ROS: Review of Systems  Constitutional: Negative for activity change, appetite change, chills, fatigue and unexpected weight change.  HENT: Negative for congestion, mouth sores and sinus pressure.   Eyes: Negative for visual disturbance.  Respiratory: Negative for cough and chest tightness.   Gastrointestinal: Negative for abdominal pain and nausea.  Genitourinary: Negative for difficulty urinating, frequency and vaginal pain.  Musculoskeletal: Negative for back pain and gait problem.  Skin: Negative for pallor and rash.  Neurological: Negative for dizziness, tremors, weakness, numbness and headaches.  Psychiatric/Behavioral: Negative for confusion and sleep disturbance.    Objective:  BP 110/66 (BP Location: Left Arm, Patient Position: Sitting, Cuff Size: Large)   Pulse 67   Temp 98.4 F (36.9 C) (Oral)   Ht 5\' 10"  (1.778 m)   Wt 203 lb (92.1 kg)   SpO2 96%   BMI 29.13 kg/m   BP Readings from Last 3 Encounters:  11/20/18 110/66  08/27/18 120/63    04/23/18 106/70    Wt Readings from Last 3 Encounters:  11/20/18 203 lb (92.1 kg)  11/14/17 175 lb (79.4 kg)  11/08/16 173 lb (78.5 kg)    Physical Exam Constitutional:      General: She is not in acute distress.    Appearance: She is well-developed.  HENT:     Head: Normocephalic.     Right Ear: External ear normal.     Left Ear: External ear normal.     Nose: Nose normal.  Eyes:     General:        Right eye: No discharge.        Left eye: No discharge.     Conjunctiva/sclera: Conjunctivae normal.     Pupils: Pupils are equal, round, and reactive to light.  Neck:     Musculoskeletal: Normal range of motion and neck supple.     Thyroid: No thyromegaly.     Vascular: No JVD.     Trachea: No tracheal deviation.  Cardiovascular:     Rate and Rhythm: Normal rate and regular rhythm.     Heart sounds: Normal heart sounds.  Pulmonary:     Effort: No respiratory distress.     Breath sounds: No stridor. No wheezing.  Abdominal:     General: Bowel sounds are normal. There is no distension.     Palpations: Abdomen is soft. There is no mass.     Tenderness: There is no abdominal tenderness. There is no  guarding or rebound.  Musculoskeletal:        General: No tenderness.  Lymphadenopathy:     Cervical: No cervical adenopathy.  Skin:    Findings: No erythema or rash.  Neurological:     Cranial Nerves: No cranial nerve deficit.     Motor: No abnormal muscle tone.     Coordination: Coordination normal.     Deep Tendon Reflexes: Reflexes normal.  Psychiatric:        Behavior: Behavior normal.        Thought Content: Thought content normal.        Judgment: Judgment normal.   L3d toe ingr toenail - a wedge was cut off Obese  Lab Results  Component Value Date   WBC 13.3 (H) 08/26/2018   HGB 10.4 (L) 08/26/2018   HCT 31.0 (L) 08/26/2018   PLT 158 08/26/2018   GLUCOSE 99 11/14/2017   CHOL 183 11/14/2017   TRIG 71.0 11/14/2017   HDL 52.10 11/14/2017   LDLCALC 117 (H)  11/14/2017   ALT 13 11/14/2017   AST 14 11/14/2017   NA 139 11/14/2017   K 4.4 11/14/2017   CL 103 11/14/2017   CREATININE 0.58 11/14/2017   BUN 11 11/14/2017   CO2 30 11/14/2017   TSH 1.49 11/14/2017    No results found.  Assessment & Plan:   There are no diagnoses linked to this encounter.   No orders of the defined types were placed in this encounter.    Follow-up: No follow-ups on file.  Sonda Primes, MD

## 2018-11-21 LAB — IRON,TIBC AND FERRITIN PANEL
%SAT: 39 % (calc) (ref 16–45)
Ferritin: 30 ng/mL (ref 16–154)
IRON: 138 ug/dL (ref 40–190)
TIBC: 352 ug/dL (ref 250–450)

## 2018-12-20 ENCOUNTER — Ambulatory Visit (INDEPENDENT_AMBULATORY_CARE_PROVIDER_SITE_OTHER): Payer: Managed Care, Other (non HMO) | Admitting: Podiatry

## 2018-12-20 ENCOUNTER — Encounter: Payer: Self-pay | Admitting: Podiatry

## 2018-12-20 VITALS — BP 120/81 | HR 89 | Resp 14

## 2018-12-20 DIAGNOSIS — L600 Ingrowing nail: Secondary | ICD-10-CM

## 2018-12-20 NOTE — Progress Notes (Signed)
Subjective:    Patient ID: Sonya [name], female    DOB: 12-07-1980, 38 y.o.   MRN: [id number]  HPI 38 year old female presents the office today with a translator for concerns of ingrown toenails.  Most symptomatic is her right big toenail but also the left third, medial aspect.  She also states that the fourth and fifth toes and started become ingrown.  History of when she was pregnant she had increased swelling to her feet.  She is tried soaking in salt water.  She denies any pus and she denies any red streaks.  She has no other concerns today.  She is currently breast-feeding   Review of Systems  Constitutional: Positive for unexpected weight change.  All other systems reviewed and are negative.  History reviewed. No pertinent past medical history.  History reviewed. No pertinent surgical history.   Current Outpatient Medications:  .  acetaminophen (TYLENOL) 500 MG tablet, Take 1,000 mg by mouth every 8 (eight) hours as needed for mild pain or headache., Disp: , Rfl:  .  docusate sodium (COLACE) 100 MG capsule, Take 1 capsule (100 mg total) by mouth 2 (two) times daily., Disp: 10 capsule, Rfl: 0 .  ferrous sulfate 325 (65 FE) MG tablet, Take 1 tablet (325 mg total) by mouth daily with breakfast., Disp: 30 tablet, Rfl: 3 .  ibuprofen (ADVIL,MOTRIN) 600 MG tablet, Take 1 tablet (600 mg total) by mouth every 6 (six) hours., Disp: 30 tablet, Rfl: 0 .  Prenatal Vit-Fe Fumarate-FA (PRENATAL MULTIVITAMIN) TABS tablet, Take 1 tablet by mouth daily at 12 noon., Disp: , Rfl:   Allergies  Allergen Reactions  . Kefzol [Cefazolin] Other (See Comments)    fever        Objective:   Physical Exam  General: AAO x3, NAD  Dermatological: Incurvation present both medial lateral aspects of the right hallux toenail with localized edema and erythema to the nail corners.  There is no purulence identified pain is no ascending cellulitis there is tenderness both nail corners.  Also localized  edema and erythema to the medial aspect of the left third toenail.  No ascending cellulitis.  Mild tenderness palpation of the fourth and fifth toenails on the left foot as well but no signs of infection.  Vascular: Dorsalis Pedis artery and Posterior Tibial artery pedal pulses are 2/4 bilateral with immedate capillary fill time. Pedal hair growth present. No varicosities and no lower extremity edema present bilateral. There is no pain with calf compression, swelling, warmth, erythema.   Neruologic: Grossly intact via light touch bilateral. Vibratory intact via tuning fork bilateral. Protective threshold with Semmes Wienstein monofilament intact to all pedal sites bilateral. Patellar and Achilles deep tendon reflexes 2+ bilateral. No Babinski or clonus noted bilateral.   Musculoskeletal: No gross boney pedal deformities bilateral. No pain, crepitus, or limitation noted with foot and ankle range of motion bilateral. Muscular strength 5/5 in all groups tested bilateral.  Gait: Unassisted, Nonantalgic.     Assessment & Plan:  38 year old female with symptomatic ingrown toenails -Treatment options discussed including all alternatives, risks, and complications -Etiology of symptoms were discussed -At this time, recommended partial nail removal without chemical matricectomy to the symptomatic toenails risks and complications were discussed with the patient for which they understand and  verbally consent to the procedure. Under sterile conditions a total of 3 mL of a mixture of 2% lidocaine plain and 0.5% Marcaine plain was infiltrated in a digital block fashion. Once anesthetized, the skin was prepped in  sterile fashion. A tourniquet was then applied. Next the medial and lateral aspect the right hallux toenail on the medial left third toenail border of the hallux nail border was sharply excised making sure to remove the entire offending nail border. Once the nail was  Removed, the area was debrided and the  underlying skin was intact. The area was irrigated and hemostasis was obtained.  A dry sterile dressing was applied. After application of the dressing the tourniquet was removed and there is found to be an immediate capillary refill time to the digit. The patient tolerated the procedure well any complications. Post procedure instructions were discussed the patient for which he verbally understood. Follow-up in one week for nail check or sooner if any problems are to arise. Discussed signs/symptoms of worsening infection and directed to call the office immediately should any occur or go directly to the emergency room. In the meantime, encouraged to call the office with any questions, concerns, changes symptoms.  Vivi Barrack DPM

## 2018-12-20 NOTE — Patient Instructions (Signed)

## 2018-12-26 ENCOUNTER — Ambulatory Visit: Payer: Managed Care, Other (non HMO) | Admitting: Podiatry

## 2018-12-29 ENCOUNTER — Ambulatory Visit (INDEPENDENT_AMBULATORY_CARE_PROVIDER_SITE_OTHER): Payer: Managed Care, Other (non HMO) | Admitting: Podiatry

## 2018-12-29 ENCOUNTER — Other Ambulatory Visit: Payer: Self-pay

## 2018-12-29 DIAGNOSIS — Z9889 Other specified postprocedural states: Secondary | ICD-10-CM

## 2018-12-29 DIAGNOSIS — L600 Ingrowing nail: Secondary | ICD-10-CM

## 2018-12-29 NOTE — Patient Instructions (Signed)
Once you finish breastfeeding you can start an over the counter medication called "fungi-nail". There are other prescription options we can consider once you finish breast feeding   Continue soaking in epsom salts twice a day followed by antibiotic ointment and a band-aid. Can leave uncovered at night. Continue this until completely healed.  If the area has not healed in 2 weeks, call the office for follow-up appointment, or sooner if any problems arise.  Monitor for any signs/symptoms of infection. Call the office immediately if any occur or go directly to the emergency room. Call with any questions/concerns.

## 2018-12-29 NOTE — Progress Notes (Signed)
Subjective: Sonya [name] is a 38 y.o.  female returns to office today for follow up evaluation after having right Hallux and left medial third partial nail avulsion performed. Patient has been soaking using epsom salts and applying topical antibiotic covered with bandaid daily. Patient denies fevers, chills, nausea, vomiting. Denies any calf pain, chest pain, SOB.   Objective:  Vitals: Reviewed  General: Well developed, nourished, in no acute distress, alert and oriented x3   Dermatology: Skin is warm, dry and supple bilateral. Right hallux and left 3rd nail border appears to be clean, dry, with mild granular tissue and surrounding scab. There is no surrounding erythema, edema, drainage/purulence. The remaining nails appear unremarkable at this time. There are no other lesions or other signs of infection present.  Neurovascular status: Intact. No lower extremity swelling; No pain with calf compression bilateral.  Musculoskeletal: No tenderness to palpation of the nail folds. Muscular strength within normal limits bilateral.   Assesement and Plan: S/p partial nail avulsion, doing well.   -Continue soaking in epsom salts twice a day followed by antibiotic ointment and a band-aid. Can leave uncovered at night. Continue this until completely healed.  -If the area has not healed in 2 weeks, call the office for follow-up appointment, or sooner if any problems arise.  -Discussed treatment for nail fungus-we will wait for her to stop breast-feeding before we start. -Monitor for any signs/symptoms of infection. Call the office immediately if any occur or go directly to the emergency room. Call with any questions/concerns.  Ovid Curd, DPM

## 2019-07-07 ENCOUNTER — Encounter: Payer: Self-pay | Admitting: Gynecology

## 2019-11-26 ENCOUNTER — Ambulatory Visit (INDEPENDENT_AMBULATORY_CARE_PROVIDER_SITE_OTHER): Payer: Managed Care, Other (non HMO) | Admitting: Internal Medicine

## 2019-11-26 ENCOUNTER — Encounter: Payer: Self-pay | Admitting: Internal Medicine

## 2019-11-26 ENCOUNTER — Other Ambulatory Visit: Payer: Self-pay

## 2019-11-26 VITALS — BP 118/76 | HR 75 | Temp 97.9°F | Ht 70.0 in | Wt 185.0 lb

## 2019-11-26 DIAGNOSIS — B351 Tinea unguium: Secondary | ICD-10-CM

## 2019-11-26 DIAGNOSIS — Z0000 Encounter for general adult medical examination without abnormal findings: Secondary | ICD-10-CM | POA: Diagnosis not present

## 2019-11-26 DIAGNOSIS — L600 Ingrowing nail: Secondary | ICD-10-CM

## 2019-11-26 DIAGNOSIS — K219 Gastro-esophageal reflux disease without esophagitis: Secondary | ICD-10-CM | POA: Diagnosis not present

## 2019-11-26 LAB — LIPID PANEL
Cholesterol: 209 mg/dL — ABNORMAL HIGH (ref 0–200)
HDL: 51 mg/dL (ref >39.00)
LDL Cholesterol: 142 mg/dL — ABNORMAL HIGH (ref 0–99)
NonHDL: 157.78
Total CHOL/HDL Ratio: 4
Triglycerides: 81 mg/dL (ref 0.0–149.0)
VLDL: 16.2 mg/dL (ref 0.0–40.0)

## 2019-11-26 LAB — HEPATIC FUNCTION PANEL
ALT: 15 U/L (ref 0–35)
AST: 13 U/L (ref 0–37)
Albumin: 4.4 g/dL (ref 3.5–5.2)
Alkaline Phosphatase: 42 U/L (ref 39–117)
Bilirubin, Direct: 0.1 mg/dL (ref 0.0–0.3)
Total Bilirubin: 0.5 mg/dL (ref 0.2–1.2)
Total Protein: 7.7 g/dL (ref 6.0–8.3)

## 2019-11-26 LAB — CBC WITH DIFFERENTIAL/PLATELET
Basophils Absolute: 0 10*3/uL (ref 0.0–0.1)
Basophils Relative: 0.5 % (ref 0.0–3.0)
Eosinophils Absolute: 0.1 10*3/uL (ref 0.0–0.7)
Eosinophils Relative: 1.9 % (ref 0.0–5.0)
HCT: 42.3 % (ref 36.0–46.0)
Hemoglobin: 14.3 g/dL (ref 12.0–15.0)
Lymphocytes Relative: 28.2 % (ref 12.0–46.0)
Lymphs Abs: 1.7 10*3/uL (ref 0.7–4.0)
MCHC: 33.7 g/dL (ref 30.0–36.0)
MCV: 83.4 fl (ref 78.0–100.0)
Monocytes Absolute: 0.4 10*3/uL (ref 0.1–1.0)
Monocytes Relative: 7.6 % (ref 3.0–12.0)
Neutro Abs: 3.7 10*3/uL (ref 1.4–7.7)
Neutrophils Relative %: 61.8 % (ref 43.0–77.0)
Platelets: 266 10*3/uL (ref 150.0–400.0)
RBC: 5.07 Mil/uL (ref 3.87–5.11)
RDW: 13.4 % (ref 11.5–15.5)
WBC: 5.9 10*3/uL (ref 4.0–10.5)

## 2019-11-26 LAB — URINALYSIS, ROUTINE W REFLEX MICROSCOPIC
Bilirubin Urine: NEGATIVE
Ketones, ur: NEGATIVE
Leukocytes,Ua: NEGATIVE
Nitrite: NEGATIVE
Specific Gravity, Urine: 1.02 (ref 1.000–1.030)
Total Protein, Urine: NEGATIVE
Urine Glucose: NEGATIVE
Urobilinogen, UA: 0.2 (ref 0.0–1.0)
pH: 7 (ref 5.0–8.0)

## 2019-11-26 LAB — BASIC METABOLIC PANEL
BUN: 13 mg/dL (ref 6–23)
CO2: 29 mEq/L (ref 19–32)
Calcium: 9.5 mg/dL (ref 8.4–10.5)
Chloride: 103 mEq/L (ref 96–112)
Creatinine, Ser: 0.66 mg/dL (ref 0.40–1.20)
GFR: 100.74 mL/min (ref >60.00)
Glucose, Bld: 94 mg/dL (ref 70–99)
Potassium: 4.1 mEq/L (ref 3.5–5.1)
Sodium: 138 mEq/L (ref 135–145)

## 2019-11-26 LAB — VITAMIN D 25 HYDROXY (VIT D DEFICIENCY, FRACTURES): VITD: 42.27 ng/mL (ref 30.00–100.00)

## 2019-11-26 LAB — TSH: TSH: 1.37 u[IU]/mL (ref 0.35–4.50)

## 2019-11-26 LAB — VITAMIN B12: Vitamin B-12: 609 pg/mL (ref 211–911)

## 2019-11-26 LAB — H. PYLORI ANTIBODY, IGG: H Pylori IgG: NEGATIVE

## 2019-11-26 MED ORDER — CICLOPIROX 8 % EX SOLN: Freq: Every day | CUTANEOUS | 0 refills | Status: DC

## 2019-11-26 MED ORDER — FAMOTIDINE 40 MG PO TABS: 40.0000 mg | ORAL_TABLET | Freq: Every day | ORAL | 3 refills | Status: DC

## 2019-11-26 NOTE — Assessment & Plan Note (Signed)
We discussed age appropriate health related issues, including available/recomended screening tests and vaccinations. We discussed a need for adhering to healthy diet and exercise. Labs were ordered to be later reviewed . All questions were answered.   

## 2019-11-26 NOTE — Assessment & Plan Note (Signed)
F/u w/Podiatry 

## 2019-11-26 NOTE — Progress Notes (Signed)
Subjective:  Patient ID: Sonya [name], female    DOB: [birth year]  Age: 39 y.o. MRN: [id number]  CC: No chief complaint on file.   HPI [name] presents for a well exam. C/o wt gain C/o GERD sx's, using TUMs x weeks C/o toenail fungus  Outpatient Medications Prior to Visit  Medication Sig Dispense Refill  . acetaminophen (TYLENOL) 500 MG tablet Take 1,000 mg by mouth every 8 (eight) hours as needed for mild pain or headache.    . docusate sodium (COLACE) 100 MG capsule Take 1 capsule (100 mg total) by mouth 2 (two) times daily. 10 capsule 0  . ferrous sulfate 325 (65 FE) MG tablet Take 1 tablet (325 mg total) by mouth daily with breakfast. 30 tablet 3  . ibuprofen (ADVIL,MOTRIN) 600 MG tablet Take 1 tablet (600 mg total) by mouth every 6 (six) hours. 30 tablet 0  . Prenatal Vit-Fe Fumarate-FA (PRENATAL MULTIVITAMIN) TABS tablet Take 1 tablet by mouth daily at 12 noon.     No facility-administered medications prior to visit.    ROS: Review of Systems  Constitutional: Negative for activity change, appetite change, chills, fatigue and unexpected weight change.  HENT: Negative for congestion, mouth sores and sinus pressure.   Eyes: Negative for visual disturbance.  Respiratory: Negative for cough and chest tightness.   Gastrointestinal: Negative for abdominal pain and nausea.  Genitourinary: Negative for difficulty urinating, frequency and vaginal pain.  Musculoskeletal: Negative for back pain and gait problem.  Skin: Negative for pallor and rash.  Neurological: Negative for dizziness, tremors, weakness, numbness and headaches.  Psychiatric/Behavioral: Negative for confusion and sleep disturbance.    Objective:  BP 118/76 (BP Location: Right Arm, Patient Position: Sitting, Cuff Size: Normal)   Pulse 75   Temp 97.9 F (36.6 C) (Oral)   Ht 5\' 10"  (1.778 m)   Wt 185 lb (83.9 kg)   SpO2 99%   BMI 26.54 kg/m   BP Readings from Last 3 Encounters:  11/26/19 118/76   12/20/18 120/81  11/20/18 110/66    Wt Readings from Last 3 Encounters:  11/26/19 185 lb (83.9 kg)  11/20/18 203 lb (92.1 kg)  11/14/17 175 lb (79.4 kg)    Physical Exam Constitutional:      General: She is not in acute distress.    Appearance: She is well-developed.  HENT:     Head: Normocephalic.     Right Ear: External ear normal.     Left Ear: External ear normal.     Nose: Nose normal.  Eyes:     General:        Right eye: No discharge.        Left eye: No discharge.     Conjunctiva/sclera: Conjunctivae normal.     Pupils: Pupils are equal, round, and reactive to light.  Neck:     Thyroid: No thyromegaly.     Vascular: No JVD.     Trachea: No tracheal deviation.  Cardiovascular:     Rate and Rhythm: Normal rate and regular rhythm.     Heart sounds: Normal heart sounds.  Pulmonary:     Effort: No respiratory distress.     Breath sounds: No stridor. No wheezing.  Abdominal:     General: Bowel sounds are normal. There is no distension.     Palpations: Abdomen is soft. There is no mass.     Tenderness: There is no abdominal tenderness. There is no guarding or rebound.  Musculoskeletal:  General: No tenderness.     Cervical back: Normal range of motion and neck supple.  Lymphadenopathy:     Cervical: No cervical adenopathy.  Skin:    Findings: No erythema or rash.  Neurological:     Cranial Nerves: No cranial nerve deficit.     Motor: No abnormal muscle tone.     Coordination: Coordination normal.     Deep Tendon Reflexes: Reflexes normal.  Psychiatric:        Behavior: Behavior normal.        Thought Content: Thought content normal.        Judgment: Judgment normal.   ingrown toenail B big toes, fungus  Lab Results  Component Value Date   WBC 6.8 11/20/2018   HGB 16.0 (H) 11/20/2018   HCT 46.5 (H) 11/20/2018   PLT 234.0 11/20/2018   GLUCOSE 88 11/20/2018   CHOL 233 (H) 11/20/2018   TRIG 112.0 11/20/2018   HDL 55.50 11/20/2018   LDLCALC 155  (H) 11/20/2018   ALT 18 11/20/2018   AST 12 11/20/2018   NA 139 11/20/2018   K 4.2 11/20/2018   CL 102 11/20/2018   CREATININE 0.67 11/20/2018   BUN 12 11/20/2018   CO2 29 11/20/2018   TSH 0.96 11/20/2018    No results found.  Assessment & Plan:    Sonda Primes, MD

## 2019-11-26 NOTE — Patient Instructions (Signed)

## 2019-11-26 NOTE — Assessment & Plan Note (Addendum)
Loose wt Pepcid Labs plus H pylori

## 2019-11-26 NOTE — Assessment & Plan Note (Signed)
[place]

## 2020-04-27 DIAGNOSIS — Z3490 Encounter for supervision of normal pregnancy, unspecified, unspecified trimester: Secondary | ICD-10-CM | POA: Insufficient documentation

## 2020-05-03 LAB — OB RESULTS CONSOLE RPR: RPR: NONREACTIVE

## 2020-05-03 LAB — OB RESULTS CONSOLE HEPATITIS B SURFACE ANTIGEN: Hepatitis B Surface Ag: NEGATIVE

## 2020-05-03 LAB — OB RESULTS CONSOLE HIV ANTIBODY (ROUTINE TESTING): HIV: NONREACTIVE

## 2020-05-03 LAB — OB RESULTS CONSOLE ABO/RH: RH Type: POSITIVE

## 2020-05-03 LAB — OB RESULTS CONSOLE RUBELLA ANTIBODY, IGM: Rubella: IMMUNE

## 2020-05-03 LAB — OB RESULTS CONSOLE ANTIBODY SCREEN: Antibody Screen: NEGATIVE

## 2020-09-28 LAB — OB RESULTS CONSOLE GBS: GBS: NEGATIVE

## 2020-10-13 ENCOUNTER — Telehealth (HOSPITAL_COMMUNITY): Payer: Self-pay | Admitting: *Deleted

## 2020-10-13 ENCOUNTER — Encounter (HOSPITAL_COMMUNITY): Payer: Self-pay | Admitting: *Deleted

## 2020-10-13 NOTE — Telephone Encounter (Signed)
Preadmission screen Interpreter number [phone]

## 2020-10-15 NOTE — L&D Delivery Note (Signed)
Delivery Note At 12:27 AM a viable female was delivered via Vaginal, Spontaneous (Presentation: Left Occiput Anterior).  APGAR: 8, 9; weight pending .   Placenta status: Spontaneous, Intact.  Cord: 3 vessels with the following complications: None. After delivery of placenta, moderate atony noted and did not initially respond to pitocin and bimanual massage.  TXA and methergine x 1 given with good response.   Anesthesia:  Local 1% lidocaine Episiotomy: None Lacerations: 1st degree Suture Repair: 3.0 vicryl rapide Est. Blood Loss (mL):  Mom to postpartum.  Baby to Couplet care / Skin to Skin.  Oliver Pila 10/21/2020, 1:19 AM

## 2020-10-20 ENCOUNTER — Telehealth (HOSPITAL_COMMUNITY): Payer: Self-pay | Admitting: *Deleted

## 2020-10-20 ENCOUNTER — Inpatient Hospital Stay (HOSPITAL_COMMUNITY)
Admission: AD | Admit: 2020-10-20 | Discharge: 2020-10-22 | DRG: 807 | Disposition: A | Discharge status: Home / Self Care | Payer: Managed Care, Other (non HMO) | Attending: Obstetrics and Gynecology | Admitting: Obstetrics and Gynecology

## 2020-10-20 ENCOUNTER — Other Ambulatory Visit: Payer: Self-pay

## 2020-10-20 DIAGNOSIS — O26893 Other specified pregnancy related conditions, third trimester: Secondary | ICD-10-CM | POA: Diagnosis present

## 2020-10-20 DIAGNOSIS — Z20822 Contact with and (suspected) exposure to covid-19: Secondary | ICD-10-CM | POA: Diagnosis present

## 2020-10-20 DIAGNOSIS — Z3A39 39 weeks gestation of pregnancy: Secondary | ICD-10-CM

## 2020-10-20 MED ORDER — OXYCODONE-ACETAMINOPHEN 5-325 MG PO TABS: 1.0000 | ORAL_TABLET | ORAL | Status: DC | PRN

## 2020-10-20 MED ORDER — SOD CITRATE-CITRIC ACID 500-334 MG/5ML PO SOLN: 30.0000 mL | ORAL | Status: DC | PRN

## 2020-10-20 MED ORDER — LACTATED RINGERS IV SOLN: 500.0000 mL | INTRAVENOUS | Status: DC | PRN

## 2020-10-20 MED ORDER — ONDANSETRON HCL 4 MG/2ML IJ SOLN: 4.0000 mg | Freq: Four times a day (QID) | INTRAMUSCULAR | Status: DC | PRN

## 2020-10-20 MED ORDER — OXYTOCIN BOLUS FROM INFUSION
333.0000 mL | Freq: Once | INTRAVENOUS | Status: AC
  Administered 2020-10-21: 333 mL via INTRAVENOUS

## 2020-10-20 MED ORDER — FENTANYL CITRATE (PF) 100 MCG/2ML IJ SOLN: 50.0000 ug | INTRAMUSCULAR | Status: DC | PRN

## 2020-10-20 MED ORDER — OXYCODONE-ACETAMINOPHEN 5-325 MG PO TABS: 2.0000 | ORAL_TABLET | ORAL | Status: DC | PRN

## 2020-10-20 MED ORDER — LACTATED RINGERS IV SOLN: INTRAVENOUS | Status: DC

## 2020-10-20 MED ORDER — ACETAMINOPHEN 325 MG PO TABS: 650.0000 mg | ORAL_TABLET | ORAL | Status: DC | PRN

## 2020-10-20 MED ORDER — OXYTOCIN-SODIUM CHLORIDE 30-0.9 UT/500ML-% IV SOLN: 2.5000 [IU]/h | INTRAVENOUS | Status: DC

## 2020-10-20 MED ORDER — LIDOCAINE HCL (PF) 1 % IJ SOLN
30.0000 mL | INTRAMUSCULAR | Status: AC | PRN
  Administered 2020-10-21: 30 mL via SUBCUTANEOUS
  Filled 2020-10-20: qty 30

## 2020-10-20 MED ORDER — OXYTOCIN-SODIUM CHLORIDE 30-0.9 UT/500ML-% IV SOLN
INTRAVENOUS | Status: AC
  Filled 2020-10-20: qty 500

## 2020-10-20 NOTE — MAU Note (Addendum)
Patient reports to MAU with ctx 1 minute apart and water broke.  Patient states it broke about 2200 on 10/20/2020.   Patient 9/90/-2 upon exam.  Wynelle Bourgeois, CNM at bedside to attend transport to l&d.

## 2020-10-20 NOTE — Telephone Encounter (Signed)
Preadmission screen Interpreter number [phone]

## 2020-10-21 ENCOUNTER — Encounter (HOSPITAL_COMMUNITY): Payer: Self-pay | Admitting: Obstetrics and Gynecology

## 2020-10-21 LAB — CBC
HCT: 42.1 % (ref 36.0–46.0)
HCT: 42.3 % (ref 36.0–46.0)
Hemoglobin: 14 g/dL (ref 12.0–15.0)
Hemoglobin: 14.2 g/dL (ref 12.0–15.0)
MCH: 30 pg (ref 26.0–34.0)
MCH: 30.5 pg (ref 26.0–34.0)
MCHC: 33.3 g/dL (ref 30.0–36.0)
MCHC: 33.6 g/dL (ref 30.0–36.0)
MCV: 90.3 fL (ref 80.0–100.0)
MCV: 91 fL (ref 80.0–100.0)
Platelets: 185 10*3/uL (ref 150–400)
Platelets: 193 10*3/uL (ref 150–400)
RBC: 4.65 MIL/uL (ref 3.87–5.11)
RBC: 4.66 MIL/uL (ref 3.87–5.11)
RDW: 13.2 % (ref 11.5–15.5)
RDW: 13.4 % (ref 11.5–15.5)
WBC: 13.9 10*3/uL — ABNORMAL HIGH (ref 4.0–10.5)
WBC: 21.7 10*3/uL — ABNORMAL HIGH (ref 4.0–10.5)
nRBC: 0 % (ref 0.0–0.2)
nRBC: 0 % (ref 0.0–0.2)

## 2020-10-21 LAB — SARS CORONAVIRUS 2 (TAT 6-24 HRS): SARS Coronavirus 2: NEGATIVE

## 2020-10-21 LAB — TYPE AND SCREEN
ABO/RH(D): A POS
Antibody Screen: NEGATIVE

## 2020-10-21 LAB — RPR: RPR Ser Ql: NONREACTIVE

## 2020-10-21 MED ORDER — COCONUT OIL OIL: 1.0000 "application " | TOPICAL_OIL | Status: DC | PRN

## 2020-10-21 MED ORDER — ZOLPIDEM TARTRATE 5 MG PO TABS: 5.0000 mg | ORAL_TABLET | Freq: Every evening | ORAL | Status: DC | PRN

## 2020-10-21 MED ORDER — BENZOCAINE-MENTHOL 20-0.5 % EX AERO: 1.0000 "application " | INHALATION_SPRAY | CUTANEOUS | Status: DC | PRN

## 2020-10-21 MED ORDER — DIPHENHYDRAMINE HCL 25 MG PO CAPS: 25.0000 mg | ORAL_CAPSULE | Freq: Four times a day (QID) | ORAL | Status: DC | PRN

## 2020-10-21 MED ORDER — DIBUCAINE (PERIANAL) 1 % EX OINT: 1.0000 "application " | TOPICAL_OINTMENT | CUTANEOUS | Status: DC | PRN

## 2020-10-21 MED ORDER — SIMETHICONE 80 MG PO CHEW: 80.0000 mg | CHEWABLE_TABLET | ORAL | Status: DC | PRN

## 2020-10-21 MED ORDER — SENNOSIDES-DOCUSATE SODIUM 8.6-50 MG PO TABS
2.0000 | ORAL_TABLET | ORAL | Status: DC
  Administered 2020-10-21 – 2020-10-22 (×2): 2 via ORAL
  Filled 2020-10-21 (×2): qty 2

## 2020-10-21 MED ORDER — FENTANYL-BUPIVACAINE-NACL 0.5-0.125-0.9 MG/250ML-% EP SOLN
EPIDURAL | Status: AC
  Filled 2020-10-21: qty 250

## 2020-10-21 MED ORDER — TRANEXAMIC ACID-NACL 1000-0.7 MG/100ML-% IV SOLN
INTRAVENOUS | Status: AC
  Administered 2020-10-21: 1000 mg
  Filled 2020-10-21: qty 100

## 2020-10-21 MED ORDER — TETANUS-DIPHTH-ACELL PERTUSSIS 5-2.5-18.5 LF-MCG/0.5 IM SUSY: 0.5000 mL | PREFILLED_SYRINGE | Freq: Once | INTRAMUSCULAR | Status: DC

## 2020-10-21 MED ORDER — PRENATAL MULTIVITAMIN CH
1.0000 | ORAL_TABLET | Freq: Every day | ORAL | Status: DC
  Administered 2020-10-21 – 2020-10-22 (×2): 1 via ORAL
  Filled 2020-10-21 (×2): qty 1

## 2020-10-21 MED ORDER — METHYLERGONOVINE MALEATE 0.2 MG/ML IJ SOLN
INTRAMUSCULAR | Status: AC
  Administered 2020-10-21: 0.2 mg
  Filled 2020-10-21: qty 1

## 2020-10-21 MED ORDER — IBUPROFEN 600 MG PO TABS
600.0000 mg | ORAL_TABLET | Freq: Four times a day (QID) | ORAL | Status: DC
  Administered 2020-10-21 – 2020-10-22 (×6): 600 mg via ORAL
  Filled 2020-10-21 (×6): qty 1

## 2020-10-21 MED ORDER — WITCH HAZEL-GLYCERIN EX PADS: 1.0000 "application " | MEDICATED_PAD | CUTANEOUS | Status: DC | PRN

## 2020-10-21 MED ORDER — ACETAMINOPHEN 325 MG PO TABS
650.0000 mg | ORAL_TABLET | ORAL | Status: DC | PRN
  Administered 2020-10-21: 650 mg via ORAL
  Filled 2020-10-21: qty 2

## 2020-10-21 MED ORDER — ONDANSETRON HCL 4 MG/2ML IJ SOLN: 4.0000 mg | INTRAMUSCULAR | Status: DC | PRN

## 2020-10-21 MED ORDER — ONDANSETRON HCL 4 MG PO TABS: 4.0000 mg | ORAL_TABLET | ORAL | Status: DC | PRN

## 2020-10-21 NOTE — Progress Notes (Signed)
PPD #0 No problems Afeb, VSS Fundus firm, NT at U-1 Continue routine postpartum care 

## 2020-10-21 NOTE — H&P (Signed)
Sonya [name] is a 40 y.o. female G3P1011 at 10 4/7 weeks (EDD 10/24/20 by 15 week [place] inconsistent with LMP) presented to MAU in active labor with advanced dilation to 9+ cm. SROM at about 2200pm and then contractions quickly became intense. Transferred to L&D and progressed quickly to complete dilation.   Prenatal care significant for: AMA with low risk NIPT Limited english, [place] european descent (r[place]ian)--husband translates but used Building control surveyor for delivery.  OB History    Gravida  3   Para  1   Term  1   Preterm      AB  1   Living  1     SAB  1   IAB      Ectopic      Multiple  0   Live Births  1         05-17-2017, 6 wks  SAB  08-25-2018, 39 wks 1. F, 7lbs, Vaginal Delivery  No past medical history on file. No past surgical history on file. Family History: family history includes Glaucoma in her father; Hypertension in her father. Social History:  reports that she has never smoked. She has never used smokeless tobacco. She reports that she does not drink alcohol and does not use drugs.     Maternal Diabetes: No Genetic Screening: Normal Maternal Ultrasounds/Referrals: Normal Fetal Ultrasounds or other Referrals:  None Maternal Substance Abuse:  No Significant Maternal Medications:  None Significant Maternal Lab Results:  Group B Strep negative Other Comments:  None  Review of Systems  Constitutional: Negative for fever.  Gastrointestinal: Positive for abdominal pain.   Maternal Medical History:  Reason for admission: Contractions.   Contractions: Onset was 1-2 hours ago.   Frequency: regular.   Perceived severity is strong.    Fetal activity: Perceived fetal activity is normal.    Prenatal complications: AMA  Prenatal Complications - Diabetes: none.    Dilation: 10 Effacement (%): 100 Station: 0 Exam by:: Germaine Pomfret, MD Blood pressure 133/64, pulse 75, temperature 97.8 F (36.6 C), temperature source Oral, resp. rate 16,  unknown if currently breastfeeding. Maternal Exam:  Uterine Assessment: Contraction strength is moderate.  Contraction frequency is regular.   Abdomen: Patient reports no abdominal tenderness. Fetal presentation: vertex  Introitus: Normal vulva. Normal vagina.  Pelvis: adequate for delivery.      Physical Exam Cardiovascular:     Rate and Rhythm: Normal rate and regular rhythm.  Pulmonary:     Effort: Pulmonary effort is normal.  Abdominal:     Palpations: Abdomen is soft.  Genitourinary:    General: Normal vulva.  Neurological:     Mental Status: She is alert.  Psychiatric:        Mood and Affect: Mood normal.     Prenatal labs: ABO, Rh: A/Positive/-- (07/20 0000) Antibody: Negative (07/20 0000) Rubella: Immune (07/20 0000) RPR: Nonreactive (07/20 0000)  HBsAg: Negative (07/20 0000)  HIV: Non-reactive (07/20 0000)  GBS: Negative/-- (12/15 0000)  NIPT low risk One hour GCT 90  Assessment/Plan: Pt progressed to c/c/+1 dilation on my arrival and anesthesia doing another epidural and labs not back.  Pt instructed to push and did well, pushed about 15 minutes for delivery.  See delivery note.    Oliver Pila 10/21/2020, 1:09 AM

## 2020-10-21 NOTE — Progress Notes (Signed)
Called to bedside by RN, patient reportedly 9cm per MAU exam. Dr. Senaida Ores en route to delivery. Patient uncomfortable with contractions. Denies any complications with pregnancy. FHT baseline 120bpm, variable decels with contractions with quick recovery to baseline, mod variability. Cat II, overall reassuring. Rechecked patient and patient found to be 10/100/0. Will standby for delivery prior to arrival of Dr. Senaida Ores.  Alric Seton, MD OB Fellow, Faculty [facility], Center for [facility] 10/21/2020 12:20 AM

## 2020-10-21 NOTE — Lactation Note (Addendum)
This note was copied from a baby's chart. Lactation Consultation Note  Patient Name: Sonya [name] [id number] Date: 10/21/2020   Age:38 hours  LC was attempting go to L&D LC talked with L&D RN Maralyn Sago) mom BF infant for 10 minutes but infant was on and off breast, mom  would like  to see LC  when she is on MBU.  ( LC no charge for L&D) Maternal Data    Feeding Feeding Type: Breast Milk with Formula added  LATCH Score Latch: Repeated attempts needed to sustain latch, nipple held in mouth throughout feeding, stimulation needed to elicit sucking reflex.  Audible Swallowing: A few with stimulation  Type of Nipple: Everted at rest and after stimulation  Comfort (Breast/Nipple): Soft / non-tender  Hold (Positioning): No assistance needed to correctly position infant at breast.  LATCH Score: 8  Interventions Interventions: Hand express  Lactation Tools Discussed/Used     Consult Status      Danelle Earthly 10/21/2020, 2:08 AM

## 2020-10-21 NOTE — Lactation Note (Signed)
This note was copied from a baby's chart. Lactation Consultation Note  Patient Name: Sonya [name] [id number] Date: 10/21/2020   Age:38 hours  Mom is a P2. She nursed her 1st child for 6 months (that child is now 9 mo old). Parents report that their 1st infant was in the hospital for 4 days b/c of jaundice. Mom reports that it took 5 days for her milk to come to volume; she described her milk supply as "just enough." Mom has a 1st Spectra Synergy pump for home.     Infant was cueing to feed when I entered room. Parents have tried to latch infant, but says that infant then falls asleep. Infant has already had formula x 1. I attempted to latch infant; infant did not get a good latch and then fell asleep.   In light of history with 1st child & it had been 4+ hours since infant last fed, parents were agreeable to giving formula. Infant was cueing when bottle was offered with the Similac slow-flow nipple. The Similac slow-flow nipple was too fast for infant, so I switched infant to the Enfamil extra-slow flow nipple. Infant did much better; Dad was shown how to crimp the nipple if needed for pacing, but infant soon began to pace herself.  Mom to pump whenever infant receives formula. Mom was observed pumping for a few minutes; size 24 flanges are appropriate at this time.   Nila Nephew, RN given an update.  Lurline Hare [facility] 10/21/2020, 7:58 AM

## 2020-10-22 MED ORDER — IBUPROFEN 600 MG PO TABS: 600.0000 mg | ORAL_TABLET | Freq: Four times a day (QID) | ORAL | 0 refills | Status: DC

## 2020-10-22 NOTE — Lactation Note (Signed)
This note was copied from a baby's chart. Lactation Consultation Note  Patient Name: [name] [id number]'[id number] Date: 10/22/2020 Reason for consult: Follow-up assessment Age:38 hours   P2 mother whose infant is now 72 hours old.  This is a term baby at 39+4 weeks.  Mother breast fed her first child (now 20 years old) for 6 months.  Family has been discharged.  Mother had a few questions prior to discharge which I answered to her satisfaction.  Mother will continue to feed 8-12 times/24 hours or sooner if baby shows feeding cues.  Parents concerned about baby getting enough at the breast if she doesn't latch and feed well.  Suggested mother continue to breast feed every time baby shows feeding cues.  Discussed "supply and demand."  Encouraged breast massage and hand expression.  Mother has a DEBP for home use and suggested she post pump if she is concerned.  It took 5 days for her milk to come to volume with her first child.  Parents verbalized understanding.  Engorgement prevention/treatment reviewed.  Mother has a manual pump.  Family receptive to learning.  RN notified and will escort family out of the hospital.  Parents have our OP phone number for any further questions/concerns.   Maternal Data    Feeding Feeding Type: Bottle Fed - Formula  LATCH Score                   Interventions    Lactation Tools Discussed/Used     Consult Status Consult Status: Complete Date: 10/22/20 Follow-up type: Call as needed    [name] [name] 10/22/2020, 2:35 PM

## 2020-10-22 NOTE — Discharge Instructions (Signed)
As per discharge pamphlet °

## 2020-10-22 NOTE — Progress Notes (Signed)
PPD #2 Doing well Afeb, VSS D/c home 

## 2020-10-22 NOTE — Discharge Summary (Signed)
Postpartum Discharge Summary      Patient Name: Sonya [name] DOB: Mar 06, 1981 MRN: [id number]  Date of admission: 10/20/2020 Delivery date:10/21/2020  Delivering provider: Huel Cote  Date of discharge: 10/22/2020  Admitting diagnosis: Normal labor [O80, Z37.9] Precipitous delivery, delivered (current hospitalization) [O62.3] Intrauterine pregnancy: [redacted]w[redacted]d     Secondary diagnosis:  Active Problems:   Precipitous delivery, delivered (current hospitalization)     Discharge diagnosis: Term Pregnancy Delivered                                              Hospital course: Onset of Labor With Vaginal Delivery      40 y.o. yo [id number] at [redacted]w[redacted]d was admitted in Active Labor on 10/20/2020. Patient had an uncomplicated labor course as follows:  Membrane Rupture Time/Date: 10:00 PM ,10/20/2020   Delivery Method:Vaginal, Spontaneous  Episiotomy: None  Lacerations:  1st degree  Patient had an uncomplicated postpartum course.  She is ambulating, tolerating a regular diet, passing flatus, and urinating well. Patient is discharged home in stable condition on 10/22/20.  Newborn Data: Birth date:10/21/2020  Birth time:12:27 AM  Gender:Female  Living status:Living  Apgars:8 ,9  Weight:3090 g    Physical exam  Vitals:   10/21/20 1015 10/21/20 1619 10/21/20 2035 10/22/20 0515  BP: 128/73 130/77 112/71 107/73  Pulse: 76 74 78 72  Resp: 18 18 18 18   Temp: 98.4 F (36.9 C) 97.7 F (36.5 C) 97.7 F (36.5 C) 98 F (36.7 C)  TempSrc: Oral Oral Oral Oral  SpO2:   99% 100%  Weight:      Height:       General: alert Lochia: appropriate Uterine Fundus: firm  Labs: Lab Results  Component Value Date   WBC 21.7 (H) 10/21/2020   HGB 14.2 10/21/2020   HCT 42.3 10/21/2020   MCV 91.0 10/21/2020   PLT 193 10/21/2020   CMP Latest Ref Rng & Units 11/26/2019  Glucose 70 - 99 mg/dL 94  BUN 6 - 23 mg/dL 13  Creatinine 01/24/2020 - 2.70 mg/dL 3.50  Sodium 0.93 - 818 mEq/L 138  Potassium 3.5 - 5.1  mEq/L 4.1  Chloride 96 - 112 mEq/L 103  CO2 19 - 32 mEq/L 29  Calcium 8.4 - 10.5 mg/dL 9.5  Total Protein 6.0 - 8.3 g/dL 7.7  Total Bilirubin 0.2 - 1.2 mg/dL 0.5  Alkaline Phos 39 - 117 U/L 42  AST 0 - 37 U/L 13  ALT 0 - 35 U/L 15   Edinburgh Score: Edinburgh Postnatal Depression Scale Screening Tool 10/21/2020  I have been able to laugh and see the funny side of things. 0  I have looked forward with enjoyment to things. 0  I have blamed myself unnecessarily when things went wrong. 0  I have been anxious or worried for no good reason. 0  I have felt scared or panicky for no good reason. 0  Things have been getting on top of me. 0  I have been so unhappy that I have had difficulty sleeping. 0  I have felt sad or miserable. 0  I have been so unhappy that I have been crying. 0  The thought of harming myself has occurred to me. 0  Edinburgh Postnatal Depression Scale Total 0      After visit meds:  Allergies as of 10/22/2020      Reactions  Kefzol [cefazolin] Other (See Comments)   fever      Medication List    STOP taking these medications   famotidine 40 MG tablet Commonly known as: Pepcid     TAKE these medications   acetaminophen 500 MG tablet Commonly known as: TYLENOL Take 1,000 mg by mouth every 8 (eight) hours as needed for mild pain or headache.   ciclopirox 8 % solution Commonly known as: Penlac Apply topically at bedtime. Apply over nail and surrounding skin. Apply daily over previous coat. After seven (7) days, may remove with alcohol and continue cycle.   docusate sodium 100 MG capsule Commonly known as: COLACE Take 1 capsule (100 mg total) by mouth 2 (two) times daily.   ibuprofen 600 MG tablet Commonly known as: ADVIL Take 1 tablet (600 mg total) by mouth every 6 (six) hours.   prenatal multivitamin Tabs tablet Take 1 tablet by mouth daily at 12 noon.        Discharge home in stable condition Infant Feeding: Breast Infant Disposition:home  with mother Discharge instruction: per After Visit Summary and Postpartum booklet. Activity: Advance as tolerated. Pelvic rest for 6 weeks.  Diet: routine diet  Postpartum Appointment:6 weeks  Future Appointments: Future Appointments  Date Time Provider Department Center  01/19/2021  8:10 AM Plotnikov, Georgina Quint, MD [id number] None   Follow up Visit:  Follow-up Information    Huel Cote, MD. Schedule an appointment as soon as possible for a visit in 6 week(s).   Specialty: Obstetrics and Gynecology Contact information: [address] [place] [address] [phone]                   10/22/2020 Zenaida Niece, MD

## 2020-10-24 ENCOUNTER — Other Ambulatory Visit (HOSPITAL_COMMUNITY): Payer: Managed Care, Other (non HMO)

## 2020-10-26 ENCOUNTER — Inpatient Hospital Stay (HOSPITAL_COMMUNITY): Payer: Managed Care, Other (non HMO)

## 2020-10-26 ENCOUNTER — Inpatient Hospital Stay (HOSPITAL_COMMUNITY)
Admission: AD | Admit: 2020-10-26 | Payer: Managed Care, Other (non HMO) | Source: Home / Self Care | Admitting: Obstetrics and Gynecology

## 2021-01-18 ENCOUNTER — Other Ambulatory Visit: Payer: Self-pay

## 2021-01-19 ENCOUNTER — Other Ambulatory Visit: Payer: Self-pay

## 2021-01-19 ENCOUNTER — Ambulatory Visit (INDEPENDENT_AMBULATORY_CARE_PROVIDER_SITE_OTHER): Payer: Managed Care, Other (non HMO) | Admitting: Internal Medicine

## 2021-01-19 ENCOUNTER — Encounter: Payer: Self-pay | Admitting: Internal Medicine

## 2021-01-19 VITALS — BP 111/72 | HR 68 | Temp 98.6°F | Ht 70.0 in | Wt 201.6 lb

## 2021-01-19 DIAGNOSIS — Z0000 Encounter for general adult medical examination without abnormal findings: Secondary | ICD-10-CM | POA: Diagnosis not present

## 2021-01-19 DIAGNOSIS — I83811 Varicose veins of right lower extremities with pain: Secondary | ICD-10-CM

## 2021-01-19 DIAGNOSIS — M542 Cervicalgia: Secondary | ICD-10-CM

## 2021-01-19 LAB — CBC WITH DIFFERENTIAL/PLATELET
Basophils Absolute: 0 10*3/uL (ref 0.0–0.1)
Basophils Relative: 0.4 % (ref 0.0–3.0)
Eosinophils Absolute: 0.1 10*3/uL (ref 0.0–0.7)
Eosinophils Relative: 1.5 % (ref 0.0–5.0)
HCT: 42.7 % (ref 36.0–46.0)
Hemoglobin: 14.9 g/dL (ref 12.0–15.0)
Lymphocytes Relative: 35.5 % (ref 12.0–46.0)
Lymphs Abs: 2 10*3/uL (ref 0.7–4.0)
MCHC: 34.9 g/dL (ref 30.0–36.0)
MCV: 85 fl (ref 78.0–100.0)
Monocytes Absolute: 0.4 10*3/uL (ref 0.1–1.0)
Monocytes Relative: 7.9 % (ref 3.0–12.0)
Neutro Abs: 3.1 10*3/uL (ref 1.4–7.7)
Neutrophils Relative %: 54.7 % (ref 43.0–77.0)
Platelets: 224 10*3/uL (ref 150.0–400.0)
RBC: 5.03 Mil/uL (ref 3.87–5.11)
RDW: 12.9 % (ref 11.5–15.5)
WBC: 5.7 10*3/uL (ref 4.0–10.5)

## 2021-01-19 LAB — URINALYSIS, ROUTINE W REFLEX MICROSCOPIC
Bilirubin Urine: NEGATIVE
Ketones, ur: NEGATIVE
Nitrite: NEGATIVE
Specific Gravity, Urine: 1.01 (ref 1.000–1.030)
Total Protein, Urine: NEGATIVE
Urine Glucose: NEGATIVE
Urobilinogen, UA: 0.2 (ref 0.0–1.0)
pH: 7.5 (ref 5.0–8.0)

## 2021-01-19 LAB — COMPREHENSIVE METABOLIC PANEL
ALT: 20 U/L (ref 0–35)
AST: 14 U/L (ref 0–37)
Albumin: 4.6 g/dL (ref 3.5–5.2)
Alkaline Phosphatase: 40 U/L (ref 39–117)
BUN: 9 mg/dL (ref 6–23)
CO2: 30 mEq/L (ref 19–32)
Calcium: 9.6 mg/dL (ref 8.4–10.5)
Chloride: 104 mEq/L (ref 96–112)
Creatinine, Ser: 0.65 mg/dL (ref 0.40–1.20)
GFR: 111.86 mL/min (ref >60.00)
Glucose, Bld: 94 mg/dL (ref 70–99)
Potassium: 4.3 mEq/L (ref 3.5–5.1)
Sodium: 140 mEq/L (ref 135–145)
Total Bilirubin: 0.7 mg/dL (ref 0.2–1.2)
Total Protein: 7.4 g/dL (ref 6.0–8.3)

## 2021-01-19 LAB — LIPID PANEL
Cholesterol: 215 mg/dL — ABNORMAL HIGH (ref 0–200)
HDL: 55.9 mg/dL (ref >39.00)
LDL Cholesterol: 140 mg/dL — ABNORMAL HIGH (ref 0–99)
NonHDL: 158.69
Total CHOL/HDL Ratio: 4
Triglycerides: 95 mg/dL (ref 0.0–149.0)
VLDL: 19 mg/dL (ref 0.0–40.0)

## 2021-01-19 LAB — TSH: TSH: 1.16 u[IU]/mL (ref 0.35–4.50)

## 2021-01-19 NOTE — Progress Notes (Signed)
Subjective:  Patient ID: Sonya [name], female    DOB: [birth year]  Age: 40 y.o. MRN: [id number]  CC: Annual Exam   HPI Sonya [name] presents for a well exam A 3 mo old baby  Outpatient Medications Prior to Visit  Medication Sig Dispense Refill  . acetaminophen (TYLENOL) 500 MG tablet Take 1,000 mg by mouth every 8 (eight) hours as needed for mild pain or headache. (Patient not taking: Reported on 01/19/2021)    . ciclopirox (PENLAC) 8 % solution Apply topically at bedtime. Apply over nail and surrounding skin. Apply daily over previous coat. After seven (7) days, may remove with alcohol and continue cycle. (Patient not taking: Reported on 01/19/2021) 6.6 mL 0  . docusate sodium (COLACE) 100 MG capsule Take 1 capsule (100 mg total) by mouth 2 (two) times daily. (Patient not taking: Reported on 01/19/2021) 10 capsule 0  . ibuprofen (ADVIL) 600 MG tablet Take 1 tablet (600 mg total) by mouth every 6 (six) hours. (Patient not taking: Reported on 01/19/2021) 30 tablet 0  . influenza vac split quadrivalent PF (AFLURIA QUADRIVALENT) 0.5 ML injection Afluria Qd 2020-21 (36 mos up)(PF)60 mcg (15 mcg x4)/0.5 mL IM syringe  PHARMACY ADMINISTERED (Patient not taking: Reported on 01/19/2021)    . Prenatal Vit-Fe Fumarate-FA (PRENATAL MULTIVITAMIN) TABS tablet Take 1 tablet by mouth daily at 12 noon. (Patient not taking: Reported on 01/19/2021)     No facility-administered medications prior to visit.    ROS: Review of Systems  Constitutional: Positive for unexpected weight change. Negative for activity change, appetite change, chills and fatigue.  HENT: Negative for congestion, mouth sores and sinus pressure.   Eyes: Negative for visual disturbance.  Respiratory: Negative for cough and chest tightness.   Gastrointestinal: Negative for abdominal pain and nausea.  Genitourinary: Negative for difficulty urinating, frequency and vaginal pain.  Musculoskeletal: Negative for back pain and gait problem.   Skin: Negative for pallor and rash.  Neurological: Negative for dizziness, tremors, weakness, numbness and headaches.  Psychiatric/Behavioral: Negative for confusion and sleep disturbance.    Objective:  BP 111/72 (BP Location: Left Arm)   Pulse 68   Temp 98.6 F (37 C) (Oral)   Ht 5\' 10"  (1.778 m)   Wt 201 lb 9.6 oz (91.4 kg)   SpO2 97%   BMI 28.93 kg/m   BP Readings from Last 3 Encounters:  01/19/21 111/72  10/22/20 107/73  11/26/19 118/76    Wt Readings from Last 3 Encounters:  01/19/21 201 lb 9.6 oz (91.4 kg)  10/21/20 184 lb 15.5 oz (83.9 kg)  11/26/19 185 lb (83.9 kg)    Physical Exam Constitutional:      General: She is not in acute distress.    Appearance: She is well-developed.  HENT:     Head: Normocephalic.     Right Ear: External ear normal.     Left Ear: External ear normal.     Nose: Nose normal.  Eyes:     General:        Right eye: No discharge.        Left eye: No discharge.     Conjunctiva/sclera: Conjunctivae normal.     Pupils: Pupils are equal, round, and reactive to light.  Neck:     Thyroid: No thyromegaly.     Vascular: No JVD.     Trachea: No tracheal deviation.  Cardiovascular:     Rate and Rhythm: Normal rate and regular rhythm.     Heart sounds: Normal heart  sounds.  Pulmonary:     Effort: No respiratory distress.     Breath sounds: No stridor. No wheezing.  Abdominal:     General: Bowel sounds are normal. There is no distension.     Palpations: Abdomen is soft. There is no mass.     Tenderness: There is no abdominal tenderness. There is no guarding or rebound.  Musculoskeletal:        General: No tenderness.     Cervical back: Normal range of motion and neck supple.  Lymphadenopathy:     Cervical: No cervical adenopathy.  Skin:    Findings: No erythema or rash.  Neurological:     Cranial Nerves: No cranial nerve deficit.     Motor: No abnormal muscle tone.     Coordination: Coordination normal.     Deep Tendon  Reflexes: Reflexes normal.  Psychiatric:        Behavior: Behavior normal.        Thought Content: Thought content normal.        Judgment: Judgment normal.   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  Assessment & Plan:      Sonda Primes, MD

## 2021-01-19 NOTE — Assessment & Plan Note (Signed)
  We discussed age appropriate health related issues, including available/recomended screening tests and vaccinations. Labs were ordered to be later reviewed . All questions were answered. We discussed one or more of the following - seat belt use, use of sunscreen/sun exposure exercise, safe sex, fall risk reduction, second hand smoke exposure, firearm use and storage, seat belt use, a need for adhering to healthy diet and exercise. Labs were ordered.  All questions were answered.  Labs ordered XRT exposure from [place] (?degree) - they will research it Vit D, folic acid

## 2021-01-19 NOTE — Addendum Note (Signed)
Addended by: Waldemar Dickens B on: 01/19/2021 09:14 AM   Modules accepted: Orders

## 2021-01-19 NOTE — Assessment & Plan Note (Signed)
?  lipoma - post neck Sports med ref

## 2021-02-24 ENCOUNTER — Encounter: Payer: Managed Care, Other (non HMO) | Admitting: Vascular Surgery

## 2021-02-24 ENCOUNTER — Encounter (HOSPITAL_COMMUNITY): Payer: Managed Care, Other (non HMO)

## 2021-03-09 NOTE — Progress Notes (Signed)
[name] [address] [place] [address] Phone: [phone] Subjective:   I Sonya [name] am serving as a Neurosurgeon for Dr. Antoine [name].   This visit occurred during the SARS-CoV-2 public health emergency.  Safety protocols were in place, including screening questions prior to the visit, additional usage of staff PPE, and extensive cleaning of exam room while observing appropriate contact time as indicated for disinfecting solutions.   I'm seeing this patient by the request  of:  Plotnikov, Georgina Quint, MD  CC: neck pain   Patient is accompanied with a translator today.  LKT:[id number]   Sonya [name] is a 40 y.o. female coming in with complaint of neck pain. Patient states she has severe headaches. Numbness and tingling in the right shoulder and back. States that the pain was after having her first child. No better after having her 2nd child about 5 months ago. Stopped breast feeding about 2 months ago. Believes it is because she has gained weight. Wants to know if posture brace will help.   Onset- 2 years  Location - Right shoulder, occiput and back  Aggravating factors- holding baby  Reliving factors-  Therapies tried- pain medication  Severity- 5/10 at its worse      No past medical history on file. No past surgical history on file. Social History   Socioeconomic History  . Marital status: Married    Spouse name: Not on file  . Number of children: Not on file  . Years of education: Not on file  . Highest education level: Not on file  Occupational History  . Not on file  Tobacco Use  . Smoking status: Never Smoker  . Smokeless tobacco: Never Used  Substance and Sexual Activity  . Alcohol use: No  . Drug use: No  . Sexual activity: Yes  Other Topics Concern  . Not on file  Social History Narrative  . Not on file   Social Determinants of Health   Financial Resource Strain: Not on file  Food Insecurity: Not on file   Transportation Needs: Not on file  Physical Activity: Not on file  Stress: Not on file  Social Connections: Not on file   Allergies  Allergen Reactions  . Kefzol [Cefazolin] Other (See Comments)    fever   Family History  Problem Relation Age of Onset  . Hypertension Father   . Glaucoma Father    No current outpatient medications on file.   Reviewed prior external information including notes and imaging from  primary care provider As well as notes that were available from care everywhere and other healthcare systems.  Past medical history, social, surgical and family history all reviewed in electronic medical record.  No pertanent information unless stated regarding to the chief complaint.   Review of Systems:  No headache, visual changes, nausea, vomiting, diarrhea, constipation, dizziness, abdominal pain, skin rash, fevers, chills, night sweats, weight loss, swollen lymph nodes, body aches, joint swelling, chest pain, shortness of breath, mood changes. POSITIVE muscle aches  Objective  Blood pressure 110/80, pulse 88, height 5\' 10"  (1.778 m), weight 195 lb (88.5 kg), SpO2 98 %, unknown if currently breastfeeding.   General: No apparent distress alert and oriented x3 mood and affect normal, dressed appropriately.  HEENT: Pupils equal, extraocular movements intact  Respiratory: Patient's speak in full sentences and does not appear short of breath  Cardiovascular: No lower extremity edema, non tender, no erythema  Gait normal with good balance and coordination.  Neck exam shows very mild tightness on the right side.  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  Patient does have some mild tightness noted in the parascapular region with some mild scapular dyskinesis noted on the left inferior scapular area.  [id number]; 15 additional minutes spent for Therapeutic exercises as stated in above notes.  This included exercises focusing on stretching,  strengthening, with significant focus on eccentric aspects.   Long term goals include an improvement in range of motion, strength, endurance as well as avoiding reinjury. Patient's frequency would include in 1-2 times a day, 3-5 times a week for a duration of 6-12 weeks. Exercises that included:  Basic scapular stabilization to include adduction and depression of scapula Scaption, focusing on proper movement and good control Internal and External rotation utilizing a theraband, with elbow tucked at side entire time Rows with theraband   Proper technique shown and discussed handout in great detail with ATC.  All questions were discussed and answered.     Impression and Recommendations:     The above documentation has been reviewed and is accurate and complete Judi Saa, DO

## 2021-03-10 ENCOUNTER — Other Ambulatory Visit: Payer: Self-pay

## 2021-03-10 ENCOUNTER — Encounter: Payer: Self-pay | Admitting: Family Medicine

## 2021-03-10 ENCOUNTER — Ambulatory Visit (INDEPENDENT_AMBULATORY_CARE_PROVIDER_SITE_OTHER): Payer: Managed Care, Other (non HMO)

## 2021-03-10 ENCOUNTER — Ambulatory Visit (INDEPENDENT_AMBULATORY_CARE_PROVIDER_SITE_OTHER): Payer: Managed Care, Other (non HMO) | Admitting: Family Medicine

## 2021-03-10 VITALS — BP 110/80 | HR 88 | Ht 70.0 in | Wt 195.0 lb

## 2021-03-10 DIAGNOSIS — G2589 Other specified extrapyramidal and movement disorders: Secondary | ICD-10-CM

## 2021-03-10 DIAGNOSIS — M546 Pain in thoracic spine: Secondary | ICD-10-CM

## 2021-03-10 DIAGNOSIS — M542 Cervicalgia: Secondary | ICD-10-CM | POA: Diagnosis not present

## 2021-03-10 NOTE — Assessment & Plan Note (Signed)
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  X-rays are pending at this time.  Do feel that this is more posture related.  Discussed home exercises and work with Event organiser.  Patient was very agreeable to start formal physical therapy as well.  We discussed which activities to do which wants to avoid.  Patient will increase activity slowly.  Patient could be a candidate for possible osteopathic manipulation.  Patient will come back again in 6 weeks and will consider it.

## 2021-03-10 NOTE — Assessment & Plan Note (Addendum)
Patient does have what appears to be some mild scapular dyskinesis.  Likely secondary to chronic repetitive difficulty with her taking care of her 2 younger children standing up position for long amount of time.  We discussed different ergonomic changes and will start with formal physical therapy.  Follow-up with me again in 6 weeks.

## 2021-03-10 NOTE — Patient Instructions (Addendum)
Good to see you Scapular exercises xrays today PT [facility] I really didn't touch on anything good Ice will be good See me again in 6-8 weeks

## 2021-03-17 ENCOUNTER — Ambulatory Visit (INDEPENDENT_AMBULATORY_CARE_PROVIDER_SITE_OTHER): Payer: Managed Care, Other (non HMO) | Admitting: Vascular Surgery

## 2021-03-17 ENCOUNTER — Other Ambulatory Visit: Payer: Self-pay

## 2021-03-17 ENCOUNTER — Encounter: Payer: Self-pay | Admitting: Vascular Surgery

## 2021-03-17 VITALS — BP 116/77 | HR 72 | Temp 98.2°F | Resp 20 | Ht 70.0 in | Wt 194.0 lb

## 2021-03-17 DIAGNOSIS — I83811 Varicose veins of right lower extremities with pain: Secondary | ICD-10-CM | POA: Diagnosis not present

## 2021-03-17 NOTE — Progress Notes (Signed)
Patient ID: Sonya [name], female   DOB: May 24, 1981, 40 y.o.   MRN: [id number]  Reason for Consult: New Patient (Initial Visit) and Varicose Veins   Referred by Plotnikov, Georgina Quint, MD  Subjective:     HPI:  Sonya [name] is a 40 y.o. female history of right lower extremity varicosities.  She was evaluated at [facility] she is now sent for second opinion.  She does have heaviness in her leg at the end of the day she also has discomfort overlying the varicose veins which she rates as moderate in pain.  She has never had any bleeding.  She denies any history of DVT.  She did have a grandmother with varicose vein she does not have any other family members.  She has had 2 children they did worsen with pregnancy.  She is planning to have a third child in the future.  All history obtained through interpreter.  History reviewed. No pertinent past medical history. Family History  Problem Relation Age of Onset  . Hypertension Father   . Glaucoma Father    History reviewed. No pertinent surgical history.  Short Social History:  Social History   Tobacco Use  . Smoking status: Never Smoker  . Smokeless tobacco: Never Used  Substance Use Topics  . Alcohol use: No    Allergies  Allergen Reactions  . Kefzol [Cefazolin] Other (See Comments)    fever    No current outpatient medications on file.   No current facility-administered medications for this visit.    Review of Systems  Constitutional:  Constitutional negative. HENT: HENT negative.  Eyes: Eyes negative.  Respiratory: Respiratory negative.  Cardiovascular: Cardiovascular negative.  GI: Gastrointestinal negative.  Musculoskeletal:       Have venous right lower extremity pain overlying varicose veins Skin: Skin negative.  Neurological: Neurological negative. Hematologic: Hematologic/lymphatic negative.  Psychiatric: Psychiatric negative.        Objective:  Objective   Vitals:   03/17/21 1540  BP:  116/77  Pulse: 72  Resp: 20  Temp: 98.2 F (36.8 C)  SpO2: 97%  Weight: 194 lb (88 kg)  Height: 5\' 10"  (1.778 m)   Body mass index is 27.84 kg/m.  Physical Exam HENT:     Head: Normocephalic.     Nose:     Comments: Wearing a mask Eyes:     Pupils: Pupils are equal, round, and reactive to light.  Cardiovascular:     Rate and Rhythm: Normal rate.     Pulses: Normal pulses.  Pulmonary:     Effort: Pulmonary effort is normal.  Abdominal:     General: Abdomen is flat.     Palpations: Abdomen is soft.  Musculoskeletal:        General: No swelling. Normal range of motion.     Cervical back: Normal range of motion and neck supple.     Comments: Large cluster varicose veins right medial thigh  Skin:    General: Skin is warm and dry.     Capillary Refill: Capillary refill takes less than 2 seconds.  Neurological:     General: No focal deficit present.     Mental Status: She is alert.  Psychiatric:        Mood and Affect: Mood normal.        Behavior: Behavior normal.        Thought Content: Thought content normal.        Judgment: Judgment normal.     Data: Outside facility  duplex ultrasound reviewed with right anterior accessory saphenous vein dilated to 12.5 mm distal to the saphenofemoral junction 5.4 mm in the proximal thigh connected to dilated tributaries measuring is wide at 7.3 mm connected to the right greater saphenous vein in the proximal calf.  Proximal right anterior accessory saphenous vein reflux is from the saphenofemoral junction down to the tributaries.  The deep system veins are compressible without reflux.     Assessment/Plan:     40 year old female with CEAP class II varicose veins with discomfort of her right lower extremity and identifiable reflux of her anterior accessory saphenous vein which is very large and connected to the tributaries by outside duplex.  I recommended compression stockings for 3 months follow-up with repeat reflux testing in our  office and if no symptoms have resolved at that time we can consider intervention.  She is considering a third pregnancy and I recommended we would wait until after the pregnancy for any intervention and she demonstrates good understanding.     Maeola Harman MD [facility]

## 2021-03-21 ENCOUNTER — Other Ambulatory Visit: Payer: Self-pay

## 2021-03-21 DIAGNOSIS — I83811 Varicose veins of right lower extremities with pain: Secondary | ICD-10-CM

## 2021-04-28 ENCOUNTER — Ambulatory Visit: Payer: Managed Care, Other (non HMO) | Admitting: Family Medicine

## 2021-05-12 ENCOUNTER — Encounter: Payer: Managed Care, Other (non HMO) | Admitting: Vascular Surgery

## 2021-05-12 ENCOUNTER — Encounter (HOSPITAL_COMMUNITY): Payer: Managed Care, Other (non HMO)

## 2021-06-02 ENCOUNTER — Ambulatory Visit (INDEPENDENT_AMBULATORY_CARE_PROVIDER_SITE_OTHER): Payer: Managed Care, Other (non HMO) | Admitting: Family Medicine

## 2021-06-02 ENCOUNTER — Other Ambulatory Visit: Payer: Self-pay

## 2021-06-02 DIAGNOSIS — G2589 Other specified extrapyramidal and movement disorders: Secondary | ICD-10-CM

## 2021-06-02 NOTE — Patient Instructions (Signed)
Good to see you See me again in  

## 2021-06-02 NOTE — Assessment & Plan Note (Signed)
Patient has made significant progress at this time.  Still has 2 different visits still left with physical therapy encouraged her to continue hep  rtc prn

## 2021-06-02 NOTE — Progress Notes (Signed)
[facility] [address] [place] [address] Phone: [phone] Subjective:    I'm seeing this patient by the request  of:  Plotnikov, Georgina Quint, MD  CC: Neck pain follow-up  [id number]  Maryelizabeth Eberle is a 40 y.o. female coming in with complaint of neck pain.  Found to have more of neck pain and so scheduled dyskinesis.  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  Patient was to start formal physical therapy.  Patient is accompanied with a translator again today.  Patient states that her neck is doing wonderful. PT has helped a lot and is off her pain medications. Patient is doing the exercises at home and that is helping a lot as well.       No past medical history on file. No past surgical history on file. Social History   Socioeconomic History   Marital status: Married    Spouse name: Not on file   Number of children: Not on file   Years of education: Not on file   Highest education level: Not on file  Occupational History   Not on file  Tobacco Use   Smoking status: Never   Smokeless tobacco: Never  Vaping Use   Vaping Use: Never used  Substance and Sexual Activity   Alcohol use: No   Drug use: No   Sexual activity: Yes  Other Topics Concern   Not on file  Social History Narrative   Not on file   Social Determinants of Health   Financial Resource Strain: Not on file  Food Insecurity: Not on file  Transportation Needs: Not on file  Physical Activity: Not on file  Stress: Not on file  Social Connections: Not on file   Allergies  Allergen Reactions   Kefzol [Cefazolin] Other (See Comments)    fever   Family History  Problem Relation Age of Onset   Hypertension Father    Glaucoma Father    No current outpatient medications on file.   Reviewed prior external information including notes and imaging from  primary care provider As well as notes that were available from care  everywhere and other healthcare systems.  Past medical history, social, surgical and family history all reviewed in electronic medical record.  No pertanent information unless stated regarding to the chief complaint.   Review of Systems:  No headache, visual changes, nausea, vomiting, diarrhea, constipation, dizziness, abdominal pain, skin rash, fevers, chills, night sweats, weight loss, swollen lymph nodes, body aches, joint swelling, chest pain, shortness of breath, mood changes.   Objective  Blood pressure 104/78, height 5\' 10"  (1.778 m), weight 188 lb (85.3 kg), unknown if currently breastfeeding.   General: No apparent distress alert and oriented x3 mood and affect normal, dressed appropriately.  HEENT: Pupils equal, extraocular movements intact  Respiratory: Patient's speak in full sentences and does not appear short of breath  Cardiovascular: No lower extremity edema, non tender, no erythema  Gait normal with good balance and coordination.  MSK:  Non tender with full range of motion and good stability and symmetric strength and tone of shoulders, elbows, wrist, hip, knee and ankles bilaterally.  Patient back exam shows full range of motion.  Nontender at this time.  Improvement in gait mild kyphosis the patient had    Impression and Recommendations:     The above documentation has been reviewed and is accurate and complete , DO

## 2021-06-21 ENCOUNTER — Other Ambulatory Visit: Payer: Self-pay

## 2021-06-21 ENCOUNTER — Ambulatory Visit (HOSPITAL_COMMUNITY)
Admission: RE | Admit: 2021-06-21 | Discharge: 2021-06-21 | Disposition: A | Discharge status: Home / Self Care | Payer: Managed Care, Other (non HMO) | Source: Ambulatory Visit | Attending: Vascular Surgery | Admitting: Vascular Surgery

## 2021-06-21 ENCOUNTER — Ambulatory Visit (INDEPENDENT_AMBULATORY_CARE_PROVIDER_SITE_OTHER): Payer: Managed Care, Other (non HMO) | Admitting: Vascular Surgery

## 2021-06-21 ENCOUNTER — Encounter: Payer: Self-pay | Admitting: Vascular Surgery

## 2021-06-21 VITALS — BP 103/70 | HR 81 | Temp 97.7°F | Resp 20 | Ht 70.0 in | Wt 188.0 lb

## 2021-06-21 DIAGNOSIS — I83811 Varicose veins of right lower extremities with pain: Secondary | ICD-10-CM | POA: Diagnosis present

## 2021-06-21 NOTE — Progress Notes (Signed)
Patient ID: Sonya [name], female   DOB: [birth year], 40 y.o.   MRN: [id number]  Reason for Consult: Follow-up   Referred by Plotnikov, Georgina Quint, MD  Subjective:     HPI:  Sonya [name] is a 40 y.o. female initially evaluated 3 months ago for right lower extremity varicosities.  She does continue to have heaviness in the right leg with discomfort overlying the varicose veins both in the calf and in the medial upper thigh.  Initially we did not have any of our own studies for reflux we are working off of an outside study.  She returns today with venous reflux studies.  She has been wearing thigh-high compression stockings religiously.  She is actually wearing thigh-high compression stockings on the left leg today states that she took off the right leg to have her reflux study.  She continues to have discomfort overlying the varicose veins and heaviness throughout the right lower extremity.  All history again obtained via [place] interpreter.  History reviewed. No pertinent past medical history. Family History  Problem Relation Age of Onset   Hypertension Father    Glaucoma Father    History reviewed. No pertinent surgical history.  Short Social History:  Social History   Tobacco Use   Smoking status: Never   Smokeless tobacco: Never  Substance Use Topics   Alcohol use: No    Allergies  Allergen Reactions   Kefzol [Cefazolin] Other (See Comments)    fever    No current outpatient medications on file.   No current facility-administered medications for this visit.    Review of Systems  Constitutional:  Constitutional negative. HENT: HENT negative.  Eyes: Eyes negative.  Cardiovascular: Positive for leg swelling.  GI: Gastrointestinal negative.  Musculoskeletal: Positive for leg pain.  Neurological: Neurological negative. Hematologic: Hematologic/lymphatic negative.       Objective:  Objective   Vitals:   06/21/21 1541  BP: 103/70  Pulse: 81  Resp: 20   Temp: 97.7 F (36.5 C)  SpO2: 96%  Weight: 188 lb (85.3 kg)  Height: 5\' 10"  (1.778 m)   Body mass index is 26.98 kg/m.  Physical Exam HENT:     Head: Normocephalic.     Nose:     Comments: Wearing a mask Cardiovascular:     Rate and Rhythm: Normal rate.     Pulses: Normal pulses.  Abdominal:     General: Abdomen is flat.  Musculoskeletal:        General: Normal range of motion.  Skin:    Capillary Refill: Capillary refill takes less than 2 seconds.  Neurological:     General: No focal deficit present.     Mental Status: She is alert.  Psychiatric:        Mood and Affect: Mood normal.       Data: RIGHT                  Reflux NoRefluxReflux TimeDiameter  cmsComments                                   Yes                                     +-----------------------+---------+------+-----------+------------+--------  +  CFV  no                                                +-----------------------+---------+------+-----------+------------+--------  +  FV prox                no                                                +-----------------------+---------+------+-----------+------------+--------  +  FV mid                 no                                                +-----------------------+---------+------+-----------+------------+--------  +  FV dist                no                                                +-----------------------+---------+------+-----------+------------+--------  +  Popliteal              no                                                +-----------------------+---------+------+-----------+------------+--------  +  GSV at [facility]                       yes    >500 ms      1.29                +-----------------------+---------+------+-----------+------------+--------  +  GSV prox thigh         no                           0.465                 +-----------------------+---------+------+-----------+------------+--------  +  GSV mid thigh          no                           0.314                +-----------------------+---------+------+-----------+------------+--------  +  GSV dist thigh                                      0.232                +-----------------------+---------+------+-----------+------------+--------  +  GSV at knee                      yes    >500 ms     0.438                +-----------------------+---------+------+-----------+------------+--------  +  GSV prox calf                                       0.349                +-----------------------+---------+------+-----------+------------+--------  +  GSV mid calf                                        0.355                +-----------------------+---------+------+-----------+------------+--------  +  SSV Pop Fossa          no                           0.364                +-----------------------+---------+------+-----------+------------+--------  +  SSV prox calf          no                           0.387                +-----------------------+---------+------+-----------+------------+--------  +  SSV mid calf                                        0.304                +-----------------------+---------+------+-----------+------------+--------  +  Proximal thigh AAGSV             yes    >500 ms     0.827                +-----------------------+---------+------+-----------+------------+--------  +  Prox to Mid thigh AAGSV          yes    >500 ms     0.684                +-----------------------+---------+------+-----------+------------+--------  +  Mid thigh AAGSV                  yes    >500 ms                          +-----------------------+---------+------+-----------+------------+--------  +      Summary:  Right:  - No evidence of superficial venous  reflux seen in the right short  saphenous vein.  - Venous reflux is noted in the right sapheno-femoral junction.  - Venous reflux is noted in the right greater saphenous vein in the thigh.  - The anterior accessory great saphenous vein is the larger branch which  gives rise to the symptomatic branches and reconnects to the great  saphenous vein at the knee causing reflux.      Assessment/Plan:     40yo WF with right lower extremity venous reflux and varicosities related to a large anterior accessory saphenous vein via duplex.  I evaluated with my own ultrasound in the office today which does demonstrate a large saphenous vein right at the area of the large varicosities.  She is wearing compression stockings today they were taken off of the right  leg for evaluation.  She continues to be symptomatic with C3 venous disease.  I discussed with her the procedural details including laser ablation of the anterior sensory saphenous vein to include stab phlebectomy of her varicosities.  She will need compression stockings both before and after the procedure.  We will get insurance approval and plan tentatively for Thursday morning in the near future.     Maeola Harman MD [facility]

## 2021-06-28 ENCOUNTER — Other Ambulatory Visit: Payer: Self-pay | Admitting: *Deleted

## 2021-06-28 DIAGNOSIS — I83811 Varicose veins of right lower extremities with pain: Secondary | ICD-10-CM

## 2021-07-11 ENCOUNTER — Encounter: Payer: Self-pay | Admitting: Internal Medicine

## 2021-07-11 ENCOUNTER — Telehealth (INDEPENDENT_AMBULATORY_CARE_PROVIDER_SITE_OTHER): Payer: Managed Care, Other (non HMO) | Admitting: Internal Medicine

## 2021-07-11 DIAGNOSIS — B019 Varicella without complication: Secondary | ICD-10-CM | POA: Diagnosis not present

## 2021-07-11 MED ORDER — VALACYCLOVIR HCL 1 G PO TABS: 1000.0000 mg | ORAL_TABLET | Freq: Three times a day (TID) | ORAL | 0 refills | Status: AC

## 2021-07-11 NOTE — Progress Notes (Addendum)
Virtual Visit via Video Note  I connected with Sonya [name] on 07/13/21 at  1:40 PM EDT by a video enabled telemedicine application and verified that I am speaking with the correct person using two identifiers.   I discussed the limitations of evaluation and management by telemedicine and the availability of in person appointments. The patient expressed understanding and agreed to proceed.  I was located at our [facility] office. The patient was at home. There was no one else present in the visit.   History of Present Illness:  C/o possible chickenpox sx's x 7 d.  The patient's older daughter was diagnosed with chickenpox by her pediatrician.  `Her daughter  prescribed acyclovir.  The patient developed similar symptoms including pain and swelling of the glands in the underarms, faint rash on the chest and extremities, chills.  She seems to be doing some better. The patient and her daughter are vaccinated against chickenpox   Observations/Objective: The patient appears to be in no acute distress, looks well. I am not able to see a rash due to suboptimal resolution of the picture.  Assessment and Plan:  See my Assessment and Plan. Follow Up Instructions:    I discussed the assessment and treatment plan with the patient. The patient was provided an opportunity to ask questions and all were answered. The patient agreed with the plan and demonstrated an understanding of the instructions.   The patient was advised to call back or seek an in-person evaluation if the symptoms worsen or if the condition fails to improve as anticipated.  I provided face-to-face time during this encounter. We were at different locations.   Sonda Primes, MD

## 2021-07-11 NOTE — Assessment & Plan Note (Addendum)
Probable aborted illness in a vaccinated adult. Seems to be getting better. Discussed  Prescribed Valtrex 1 g tid po x 7 d

## 2021-08-02 MED ORDER — LORAZEPAM 1 MG PO TABS: 1.0000 mg | ORAL_TABLET | Freq: Two times a day (BID) | ORAL | 0 refills | Status: DC | PRN

## 2021-08-02 NOTE — Progress Notes (Deleted)
Ativan 1 mg 2 tablets no refills take 1 tablet 30 minutes before leaving house on the day of the procedure.  Bring second tablet with you to the office.  

## 2021-08-03 ENCOUNTER — Encounter: Payer: Self-pay | Admitting: Vascular Surgery

## 2021-08-03 ENCOUNTER — Ambulatory Visit (INDEPENDENT_AMBULATORY_CARE_PROVIDER_SITE_OTHER): Payer: Managed Care, Other (non HMO) | Admitting: Vascular Surgery

## 2021-08-03 ENCOUNTER — Other Ambulatory Visit: Payer: Self-pay

## 2021-08-03 VITALS — BP 120/80 | HR 89 | Temp 97.8°F | Resp 14 | Ht 70.0 in | Wt 187.0 lb

## 2021-08-03 DIAGNOSIS — I83811 Varicose veins of right lower extremities with pain: Secondary | ICD-10-CM

## 2021-08-03 NOTE — Progress Notes (Signed)
     Laser Ablation Procedure    Date: 08/03/2021   Sonya [name] [id number] DOB:1981-05-01  Consent signed: Yes      Surgeon: Lemar Livings  MD  Procedure: Laser Ablation: right Greater Saphenous Vein  BP 120/80 (BP Location: Left Arm, Patient Position: Sitting, Cuff Size: Normal)   Pulse 89   Temp 97.8 F (36.6 C) (Temporal)   Resp 14   Ht 5\' 10"  (1.778 m)   Wt 187 lb (84.8 kg)   SpO2 99%   BMI 26.83 kg/m   Tumescent Anesthesia: 300 cc 0.9% NaCl with 50 cc Lidocaine HCL 1%  and 15 cc 8.4% NaHCO3  Local Anesthesia: 14 cc Lidocaine HCL and NaHCO3 (ratio 2:1)  7 watts continuous mode     Total energy: 457 joules    Total time: 65 seconds Treatment Length  10 centimeters  Laser Fiber Ref. #                              Lot #  [id number]   Stab Phlebectomy: 10 to 20 Sites: Thigh and Calf  Patient tolerated procedure well  Notes: Patient wore face mask.  All staff members wore facial masks and facial shields/goggles.    Patient took Ativan 1 mg  @ 10:00 am and Ativan 1 mg @ 11:00 am   Description of Procedure:  After marking the course of the secondary varicosities, the patient was placed on the operating table in the supine position, and the right leg was prepped and draped in sterile fashion.   Local anesthetic was administered and under ultrasound guidance the saphenous vein was accessed with a micro needle and guide wire; then the mirco puncture sheath was placed.  A guide wire was inserted saphenofemoral junction , followed by a 5 french sheath.  The position of the sheath and then the laser fiber below the junction was confirmed using the ultrasound.  Tumescent anesthesia was administered along the course of the saphenous vein using ultrasound guidance. The patient was placed in Trendelenburg position and protective laser glasses were placed on patient and staff, and the laser was fired at 7 watts continuous mode for a total of 457 joules.   For stab phlebectomies,  local anesthetic was administered at the previously marked varicosities, and tumescent anesthesia was administered around the vessels.  Ten to 20 stab wounds were made using the tip of an 11 blade. And using the vein hook, the phlebectomies were performed using a hemostat to avulse the varicosities.  Adequate hemostasis was achieved.     Steri strips were applied to the stab wounds and ABD pads and thigh high compression stockings were applied.  Ace wrap bandages were applied over the phlebectomy sites and at the top of the saphenofemoral junction. Blood loss was less than 15 cc.  Discharge instructions reviewed with patient and hardcopy of discharge instructions given to patient to take home. The patient ambulated out of the operating room having tolerated the procedure well.

## 2021-08-03 NOTE — Progress Notes (Signed)
      Patient name: Sonya [name] MRN: [id number] DOB: 03-Feb-1981 Sex: female  REASON FOR VISIT: Laser ablation and stab phlebectomy  HPI: Sonya [name] is a 40 y.o. female history of right sided C3 venous disease with large accessory saphenous vein that is refluxing and large medial thigh and calf vein varicosities.  She has undergone trial of compression stockings thigh-high 20 to 30 mmHg with some relief but still has aching and throbbing.  She is now indicated for the above procedure.  Current Outpatient Medications  Medication Sig Dispense Refill   LORazepam (ATIVAN) 1 MG tablet Take 1 tablet (1 mg total) by mouth 2 (two) times daily as needed for up to 2 doses for anxiety. 2 tablet 0   valACYclovir (VALTREX) 1000 MG tablet Take 1 tablet (1,000 mg total) by mouth 3 (three) times daily. (Patient not taking: Reported on 08/03/2021) 21 tablet 0   No current facility-administered medications for this visit.    PHYSICAL EXAM: Vitals:   08/03/21 1048  BP: 120/80  Pulse: 89  Resp: 14  Temp: 97.8 F (36.6 C)  TempSrc: Temporal  SpO2: 99%  Weight: 187 lb (84.8 kg)  Height: 5\' 10"  (1.778 m)    PROCEDURE: Laser ablation anterior accessory saphenous vein total length 7 cm and stab phlebectomy totaling 10-20  TECHNIQUE: Patient was correctly identified she was placed in the procedure room standing straight up on her varicose veins were identified and marked.  She was then placed supine on the table.  Her 1 mg of Ativan was administered p.o.  She was then sterilely prepped and draped in the left lower extremity.  Ultrasound was used to identify the accessory saphenous vein as well as a small greater saphenous vein.  The accessory saphenous vein during the greater saphenous vein just prior to the saphenofemoral junction.  Her skin was anesthetized with 1% lidocaine and her sensory saphenous vein was cannulated with micropuncture needle followed by wire and sheath.  A J-wire was placed to  the saphenofemoral junction.  This was confirmed in transverse and longitudinal views with ultrasound.  We then placed the laser sheath this was 3.2 cm from the saphenofemoral junction and distal to the greater saphenous vein junction with the accessory saphenous vein.  The laser was then placed confirmed in place.  We then administered tumescent anesthesia along the length of the vein.  The laser was then used at 7 W to pull back of 1 cm every 7 seconds for a total of 50 J.  The common femoral vein was noted to be patent as was the greater saphenous vein leading into the junction the accessory saphenous vein was no longer visible.  We then anesthetized the skin over the previously marked varicosities.  10-20 stab incisions were made with 11 blade and the veins were phlebectomy lysed using a crochet hook.  A compressive dressing was then placed.  Patient will follow-up in 1 week.  [facility] [place] [phone]

## 2021-08-17 ENCOUNTER — Other Ambulatory Visit: Payer: Self-pay

## 2021-08-17 ENCOUNTER — Ambulatory Visit (INDEPENDENT_AMBULATORY_CARE_PROVIDER_SITE_OTHER): Payer: Managed Care, Other (non HMO) | Admitting: Vascular Surgery

## 2021-08-17 ENCOUNTER — Encounter: Payer: Self-pay | Admitting: Vascular Surgery

## 2021-08-17 ENCOUNTER — Ambulatory Visit (HOSPITAL_COMMUNITY)
Admission: RE | Admit: 2021-08-17 | Discharge: 2021-08-17 | Disposition: A | Discharge status: Home / Self Care | Payer: Managed Care, Other (non HMO) | Source: Ambulatory Visit | Attending: Surgery | Admitting: Surgery

## 2021-08-17 VITALS — BP 120/80 | HR 68 | Temp 98.0°F | Resp 18 | Ht 70.0 in | Wt 185.6 lb

## 2021-08-17 DIAGNOSIS — I83811 Varicose veins of right lower extremities with pain: Secondary | ICD-10-CM | POA: Insufficient documentation

## 2021-08-17 NOTE — Progress Notes (Signed)
Subjective:     Patient ID: Sonya [name], female   DOB: 05/10/1981, 40 y.o.   MRN: [id number]  HPI 40 year old female follows up 2 weeks after right anterior excessively saphenous vein ablation and stab phlebectomy totaling 10-20.  She does have some pain and bruising along the right medial aspect of her upper and lower leg.  She also has a palpable lump in the right groin which is painful.  She is taking ibuprofen.  She continues to wear her thigh-high stockings.  All information obtained via interpreter as patient native language is [place]   Review of Systems As above, bruising and pain along right upper and lower medial leg at site of phlebectomy and laser ablation    Objective:   Physical Exam Vitals:   08/17/21 1022  BP: 120/80  Pulse: 68  Resp: 18  Temp: 98 F (36.7 C)  SpO2: 100%  Awake alert and oriented Palpable cord in the right groin is tender       RIGHT    CompressibilityPhasicitySpontaneityPropertiesThrombus  Aging  +---------+---------------+---------+-----------+----------+--------------+   CFV      Full           Yes      Yes                                    +---------+---------------+---------+-----------+----------+--------------+   FV Prox  Full                                                           +---------+---------------+---------+-----------+----------+--------------+   FV Mid   Full                                                           +---------+---------------+---------+-----------+----------+--------------+   FV DistalFull                                                           +---------+---------------+---------+-----------+----------+--------------+   AAGSV    None           No       No         dilated                     +---------+---------------+---------+-----------+----------+--------------+             +----+---------------+---------+-----------+----------+--------------+  LEFTCompressibilityPhasicitySpontaneityPropertiesThrombus Aging  +----+---------------+---------+-----------+----------+--------------+  CFV Full           Yes      Yes                                  +----+---------------+---------+-----------+----------+--------------+       Post Ablation Summary:  - Successful ablation of the right Anterior Accessory Great Saphenous Vein  from the Mid thigh to 1.8 cm from the sapheno-femoral junction.  Assessment:     40 year old female status post successful right anterior sensory saphenous vein ablation and stab phlebectomy.    Plan:     She will continue compression stockings as tolerated  Ibuprofen as needed for thrombosed anterior sensory saphenous vein and painful phlebectomy sites  Follow-up as needed.    Xee Hollman C. Randie Heinz, MD [facility] Office: [phone] Pager: [phone]

## 2021-08-24 ENCOUNTER — Ambulatory Visit: Payer: Managed Care, Other (non HMO) | Admitting: Podiatry

## 2021-09-05 ENCOUNTER — Other Ambulatory Visit: Payer: Self-pay

## 2021-09-05 ENCOUNTER — Encounter: Payer: Self-pay | Admitting: Podiatry

## 2021-09-05 ENCOUNTER — Ambulatory Visit (INDEPENDENT_AMBULATORY_CARE_PROVIDER_SITE_OTHER): Payer: Managed Care, Other (non HMO) | Admitting: Podiatry

## 2021-09-05 DIAGNOSIS — M79674 Pain in right toe(s): Secondary | ICD-10-CM

## 2021-09-05 DIAGNOSIS — L600 Ingrowing nail: Secondary | ICD-10-CM

## 2021-09-05 MED ORDER — MUPIROCIN 2 % EX OINT: 1.0000 "application " | TOPICAL_OINTMENT | Freq: Two times a day (BID) | CUTANEOUS | 2 refills | Status: AC

## 2021-09-05 NOTE — Patient Instructions (Signed)

## 2021-09-12 NOTE — Progress Notes (Signed)
Subjective:   Patient ID: Sonya [name], female   DOB: 40 y.o.   MRN: [id number]   HPI 40 year old female presents the office today for concerns of recurrent ingrown toenails of the right big toe, lateral aspect.  Patient was removed about 3 years ago and did well but recently started come back.  She denies any drainage or pus.  He has had some redness around the nail border but no red streaks.  Area is tender with pressure.  No other concerns.   Review of Systems  All other systems reviewed and are negative.  Past Medical History:  Diagnosis Date   Varicose veins of leg with pain, right     History reviewed. No pertinent surgical history.   Current Outpatient Medications:    mupirocin ointment (BACTROBAN) 2 %, Apply 1 application topically 2 (two) times daily., Disp: 30 g, Rfl: 2   LORazepam (ATIVAN) 1 MG tablet, Take 1 tablet (1 mg total) by mouth 2 (two) times daily as needed for up to 2 doses for anxiety. (Patient not taking: Reported on 08/17/2021), Disp: 2 tablet, Rfl: 0   valACYclovir (VALTREX) 1000 MG tablet, Take 1 tablet (1,000 mg total) by mouth 3 (three) times daily. (Patient not taking: Reported on 08/03/2021), Disp: 21 tablet, Rfl: 0  Allergies  Allergen Reactions   Kefzol [Cefazolin] Other (See Comments)    fever          Objective:  Physical Exam  General: AAO x3, NAD  Dermatological: Ingrown toenail present to the right big toe, lateral aspect.  There is tenderness palpation of the nail border.  There is localized edema and erythema likely more from inflammation as opposed to infection.  There is no ascending cellulitis.  There is no purulence or abscess noted.  Vascular: Dorsalis Pedis artery and Posterior Tibial artery pedal pulses are 2/4 bilateral with immedate capillary fill time. There is no pain with calf compression, swelling, warmth, erythema.   Neruologic: Grossly intact via light touch bilateral.    Musculoskeletal: Tenderness to the ingrown  toenail but no other areas of discomfort.  No pain, crepitus, or limitation noted with foot and ankle range of motion bilateral. Muscular strength 5/5 in all groups tested bilateral.  Gait: Unassisted, Nonantalgic.       Assessment:   Ingrown toenail right lateral nail border     Plan:  -Treatment options discussed including all alternatives, risks, and complications -Etiology of symptoms were discussed -At this time, the patient is requesting partial nail removal with chemical matricectomy to the symptomatic portion of the nail. Risks and complications were discussed with the patient for which they understand and written consent was obtained. Under sterile conditions a total of 3 mL of a mixture of 2% lidocaine plain and 0.5% Marcaine plain was infiltrated in a hallux block fashion. Once anesthetized, the skin was prepped in sterile fashion. A tourniquet was then applied. Next the medial aspect of hallux nail border was then sharply excised making sure to remove the entire offending nail border. Once the nails were ensured to be removed area was debrided and the underlying skin was intact. There is no purulence identified in the procedure. Next phenol was then applied under standard conditions and copiously irrigated. Silvadene was applied. A dry sterile dressing was applied. After application of the dressing the tourniquet was removed and there is found to be an immediate capillary refill time to the digit. The patient tolerated the procedure well any complications. Post procedure instructions were discussed the  patient for which he verbally understood. Follow-up in one week for nail check or sooner if any problems are to arise. Discussed signs/symptoms of infection and directed to call the office immediately should any occur or go directly to the emergency room. In the meantime, encouraged to call the office with any questions, concerns, changes symptoms. -Mupirocin ointment ordered  Return in  about 2 weeks (around 09/19/2021) for nail check .  Vivi Barrack DPM

## 2021-09-21 ENCOUNTER — Ambulatory Visit (INDEPENDENT_AMBULATORY_CARE_PROVIDER_SITE_OTHER): Payer: Managed Care, Other (non HMO) | Admitting: Podiatry

## 2021-09-21 ENCOUNTER — Other Ambulatory Visit: Payer: Self-pay

## 2021-09-21 DIAGNOSIS — L600 Ingrowing nail: Secondary | ICD-10-CM

## 2021-09-21 NOTE — Patient Instructions (Signed)

## 2021-09-24 NOTE — Progress Notes (Signed)
Subjective: 40 year old female presents the office today for follow-up evaluation after undergoing partial nail avulsion of the right big toe, lateral aspect.  She states it feels much better.  She has no pain.  No swelling or redness or any drainage.  She has no other concerns.  Objective: AAO x3, NAD DP/PT pulses palpable bilaterally, CRT less than 3 seconds Status post partial nail avulsion of the right lateral nail border.  There is no pain and there is no edema, erythema.  There is no open sore.  It is healed.  No drainage or pus.  Mild incurvation left hallux without any signs of infection. No pain with calf compression, swelling, warmth, erythema  Assessment: Status post partial nail avulsion right hallux  Plan: -All treatment options discussed with the patient including all alternatives, risks, complications.  -At this point doing much better.  I would keep a small amount of antibiotic ointment and a bandage on during the day but leave the area open at nighttime.  Wash with soap and water daily.  Monitor for any reoccurrence or signs of infection. -Patient encouraged to call the office with any questions, concerns, change in symptoms.   Vivi Barrack DPM

## 2022-01-07 ENCOUNTER — Encounter: Payer: Self-pay | Admitting: Internal Medicine

## 2022-01-07 ENCOUNTER — Encounter: Payer: Self-pay | Admitting: Podiatry

## 2022-01-08 ENCOUNTER — Other Ambulatory Visit: Payer: Self-pay | Admitting: Podiatry

## 2022-01-08 MED ORDER — SULFAMETHOXAZOLE-TRIMETHOPRIM 800-160 MG PO TABS
1.0000 | ORAL_TABLET | Freq: Two times a day (BID) | ORAL | 0 refills | Status: DC
Start: 2022-01-08 — End: 2023-03-28

## 2022-01-08 NOTE — Telephone Encounter (Signed)
Lmom to call back and schedule appointment.   

## 2022-01-13 ENCOUNTER — Ambulatory Visit
Admission: EM | Admit: 2022-01-13 | Discharge: 2022-01-13 | Disposition: A | Discharge status: Home / Self Care | Payer: Managed Care, Other (non HMO) | Attending: Urgent Care | Admitting: Urgent Care

## 2022-01-13 DIAGNOSIS — J019 Acute sinusitis, unspecified: Secondary | ICD-10-CM | POA: Diagnosis not present

## 2022-01-13 DIAGNOSIS — R051 Acute cough: Secondary | ICD-10-CM

## 2022-01-13 DIAGNOSIS — R0981 Nasal congestion: Secondary | ICD-10-CM

## 2022-01-13 MED ORDER — PROMETHAZINE-DM 6.25-15 MG/5ML PO SYRP: 5.0000 mL | ORAL_SOLUTION | Freq: Three times a day (TID) | ORAL | 0 refills | Status: DC | PRN

## 2022-01-13 MED ORDER — PSEUDOEPHEDRINE HCL 60 MG PO TABS: 60.0000 mg | ORAL_TABLET | Freq: Three times a day (TID) | ORAL | 0 refills | Status: AC | PRN

## 2022-01-13 MED ORDER — BENZONATATE 100 MG PO CAPS: 100.0000 mg | ORAL_CAPSULE | Freq: Three times a day (TID) | ORAL | 0 refills | Status: DC | PRN

## 2022-01-13 MED ORDER — LEVOCETIRIZINE DIHYDROCHLORIDE 5 MG PO TABS
5.0000 mg | ORAL_TABLET | Freq: Every evening | ORAL | 0 refills | Status: AC
Start: 2022-01-13 — End: ?

## 2022-01-13 MED ORDER — AMOXICILLIN-POT CLAVULANATE 875-125 MG PO TABS
1.0000 | ORAL_TABLET | Freq: Two times a day (BID) | ORAL | 0 refills | Status: DC
Start: 2022-01-13 — End: 2022-01-23

## 2022-01-13 NOTE — ED Provider Notes (Signed)
?[facility] ? ? ?MRN: [id number] DOB: 1981-04-30 ? ?Subjective:  ? ?Sonya [name] is a 41 y.o. female presenting for 1 week history of persistent [name], Sonya [name], Sonya [name], Sonya [name], Sonya and fatigue.  No history of smoking, asthma. No respiratory disorders. She is currently taking Bactrim to help with a possible infected ingrown toenail.  ? ?No current facility-administered medications for this encounter. ? ?Current Outpatient Medications:  ?  LORazepam (ATIVAN) 1 MG tablet, Take 1 tablet (1 mg total) by mouth 2 (two) times daily as needed for up to 2 doses for anxiety. (Patient not taking: Reported on 08/17/2021), Disp: 2 tablet, Rfl: 0 ?  mupirocin ointment (BACTROBAN) 2 %, Apply 1 application topically 2 (two) times daily., Disp: 30 g, Rfl: 2 ?  sulfamethoxazole-trimethoprim (BACTRIM DS) 800-160 MG tablet, Take 1 tablet by mouth 2 (two) times daily., Disp: 14 tablet, Rfl: 0 ?  valACYclovir (VALTREX) 1000 MG tablet, Take 1 tablet (1,000 mg total) by mouth 3 (three) times daily. (Patient not taking: Reported on 08/03/2021), Disp: 21 tablet, Rfl: 0  ? ?Allergies  ?Allergen Reactions  ? Kefzol [Cefazolin] Other (See Comments)  ?  fever  ? ? ?Past Medical History:  ?Diagnosis Date  ? Varicose veins of leg with pain, right   ?  ? ?History reviewed. No pertinent surgical history. ? ?Family History  ?Problem Relation Age of Onset  ? Hypertension Father   ? Glaucoma Father   ? ? ?Social History  ? ?Tobacco Use  ? Smoking status: Never  ? Smokeless tobacco: Never  ?Vaping Use  ? Vaping Use: Never used  ?Substance Use Topics  ? Alcohol use: No  ? Drug use: No  ? ? ?ROS ? ? ?Objective:  ? ?Vitals: ?BP 132/80 (BP Location: Right Arm)   Pulse 94   Temp 98.4 ?F (36.9 ?C) (Oral)   Resp (!) 24   LMP 12/06/2021 (Exact Date)   SpO2 97%   Breastfeeding No  ? ?Physical Exam ?Constitutional:   ?   General: She is not in acute distress. ?   Appearance: Normal appearance. She is  well-developed and normal weight. She is not ill-appearing, toxic-appearing or diaphoretic.  ?HENT:  ?   Head: Normocephalic and atraumatic.  ?   Right Ear: Tympanic membrane, ear canal and external ear normal. No drainage or tenderness. No middle ear effusion. There is no impacted cerumen. Tympanic membrane is not erythematous.  ?   Left Ear: Tympanic membrane, ear canal and external ear normal. No drainage or tenderness.  No middle ear effusion. There is no impacted cerumen. Tympanic membrane is not erythematous.  ?   Nose: [name] present. No rhinorrhea.  ?   Mouth/Throat:  ?   Mouth: Mucous membranes are moist. No oral lesions.  ?   Pharynx: No pharyngeal swelling, oropharyngeal exudate, posterior oropharyngeal erythema or uvula swelling.  ?   Tonsils: No tonsillar exudate or tonsillar abscesses.  ?   Comments: Post-nasal drainage overlying pharynx.  ?Eyes:  ?   General: No scleral icterus.    ?   Right eye: No discharge.     ?   Left eye: No discharge.  ?   Extraocular Movements: Extraocular movements intact.  ?   Right eye: Normal extraocular motion.  ?   Left eye: Normal extraocular motion.  ?   Conjunctiva/sclera: Conjunctivae normal.  ?Cardiovascular:  ?   Rate and Rhythm: Normal rate.  ?   Heart sounds: No murmur heard. ?  No  friction rub. No gallop.  ?Pulmonary:  ?   Effort: Pulmonary effort is normal. No respiratory distress.  ?   Breath sounds: No stridor. No wheezing, rhonchi or rales.  ?Chest:  ?   Chest wall: No tenderness.  ?Musculoskeletal:  ?   Cervical back: Normal range of motion and neck supple.  ?Lymphadenopathy:  ?   Cervical: No cervical adenopathy.  ?Skin: ?   General: Skin is warm and dry.  ?Neurological:  ?   General: No focal deficit present.  ?   Mental Status: She is alert and oriented to person, place, and time.  ?Psychiatric:     ?   Mood and Affect: Mood normal.     ?   Behavior: Behavior normal.     ?   Thought Content: Thought content normal.     ?   Judgment: Judgment normal.   ? ? ? ?Assessment and Plan :  ? ?PDMP not reviewed this encounter. ? ?1. Acute non-recurrent sinusitis, unspecified location   ?2. Sonya [name]   ?3. Acute cough   ? ?Deferred imaging given clear cardiopulmonary exam, hemodynamically stable vital signs. Will start empiric treatment for sinusitis with Augmentin.  Recommended supportive care otherwise including the use of oral antihistamine, decongestant. Counseled patient on potential for adverse effects with medications prescribed/recommended today, ER and return-to-clinic precautions discussed, patient verbalized understanding. ? ?  ?Wallis Bamberg, PA-C ?01/13/22 1143 ? ?

## 2022-01-13 NOTE — ED Triage Notes (Signed)
Pt reports cough x 1 week, cough is worse at night; nasal congestion x 4 days; headache and and feeling x 1 day. [facility] gives some relief.  ? ?Pt reports 2 negative home COVID test.  ?

## 2022-01-15 ENCOUNTER — Other Ambulatory Visit: Payer: Self-pay | Admitting: Podiatry

## 2022-01-15 NOTE — Progress Notes (Signed)
error 

## 2022-01-15 NOTE — Telephone Encounter (Signed)
Can you try to get her in? Thanks! ?

## 2022-01-23 ENCOUNTER — Encounter: Payer: Self-pay | Admitting: Internal Medicine

## 2022-01-23 ENCOUNTER — Ambulatory Visit (INDEPENDENT_AMBULATORY_CARE_PROVIDER_SITE_OTHER): Payer: Managed Care, Other (non HMO) | Admitting: Podiatry

## 2022-01-23 ENCOUNTER — Ambulatory Visit (INDEPENDENT_AMBULATORY_CARE_PROVIDER_SITE_OTHER): Payer: Managed Care, Other (non HMO) | Admitting: Internal Medicine

## 2022-01-23 VITALS — BP 98/60 | HR 78 | Temp 98.5°F | Ht 70.0 in | Wt 183.0 lb

## 2022-01-23 DIAGNOSIS — Z0000 Encounter for general adult medical examination without abnormal findings: Secondary | ICD-10-CM

## 2022-01-23 DIAGNOSIS — R5383 Other fatigue: Secondary | ICD-10-CM | POA: Diagnosis not present

## 2022-01-23 DIAGNOSIS — Z136 Encounter for screening for cardiovascular disorders: Secondary | ICD-10-CM | POA: Diagnosis not present

## 2022-01-23 DIAGNOSIS — L600 Ingrowing nail: Secondary | ICD-10-CM | POA: Diagnosis not present

## 2022-01-23 DIAGNOSIS — R052 Subacute cough: Secondary | ICD-10-CM

## 2022-01-23 DIAGNOSIS — R059 Cough, unspecified: Secondary | ICD-10-CM | POA: Insufficient documentation

## 2022-01-23 LAB — COMPREHENSIVE METABOLIC PANEL
ALT: 15 U/L (ref 0–35)
AST: 13 U/L (ref 0–37)
Albumin: 4.5 g/dL (ref 3.5–5.2)
Alkaline Phosphatase: 36 U/L — ABNORMAL LOW (ref 39–117)
BUN: 10 mg/dL (ref 6–23)
CO2: 29 mEq/L (ref 19–32)
Calcium: 9.4 mg/dL (ref 8.4–10.5)
Chloride: 105 mEq/L (ref 96–112)
Creatinine, Ser: 0.59 mg/dL (ref 0.40–1.20)
GFR: 113.69 mL/min (ref >60.00)
Glucose, Bld: 91 mg/dL (ref 70–99)
Potassium: 4.1 mEq/L (ref 3.5–5.1)
Sodium: 141 mEq/L (ref 135–145)
Total Bilirubin: 0.5 mg/dL (ref 0.2–1.2)
Total Protein: 7.8 g/dL (ref 6.0–8.3)

## 2022-01-23 LAB — CBC WITH DIFFERENTIAL/PLATELET
Basophils Absolute: 0 10*3/uL (ref 0.0–0.1)
Basophils Relative: 0.5 % (ref 0.0–3.0)
Eosinophils Absolute: 0.1 10*3/uL (ref 0.0–0.7)
Eosinophils Relative: 1.2 % (ref 0.0–5.0)
HCT: 41 % (ref 36.0–46.0)
Hemoglobin: 13.9 g/dL (ref 12.0–15.0)
Lymphocytes Relative: 34.1 % (ref 12.0–46.0)
Lymphs Abs: 1.9 10*3/uL (ref 0.7–4.0)
MCHC: 34 g/dL (ref 30.0–36.0)
MCV: 80.7 fl (ref 78.0–100.0)
Monocytes Absolute: 0.4 10*3/uL (ref 0.1–1.0)
Monocytes Relative: 7.1 % (ref 3.0–12.0)
Neutro Abs: 3.2 10*3/uL (ref 1.4–7.7)
Neutrophils Relative %: 57.1 % (ref 43.0–77.0)
Platelets: 298 10*3/uL (ref 150.0–400.0)
RBC: 5.08 Mil/uL (ref 3.87–5.11)
RDW: 13.5 % (ref 11.5–15.5)
WBC: 5.5 10*3/uL (ref 4.0–10.5)

## 2022-01-23 LAB — LIPID PANEL
Cholesterol: 208 mg/dL — ABNORMAL HIGH (ref 0–200)
HDL: 47.6 mg/dL (ref >39.00)
LDL Cholesterol: 137 mg/dL — ABNORMAL HIGH (ref 0–99)
NonHDL: 159.9
Total CHOL/HDL Ratio: 4
Triglycerides: 114 mg/dL (ref 0.0–149.0)
VLDL: 22.8 mg/dL (ref 0.0–40.0)

## 2022-01-23 LAB — URINALYSIS
Bilirubin Urine: NEGATIVE
Ketones, ur: NEGATIVE
Leukocytes,Ua: NEGATIVE
Nitrite: NEGATIVE
Specific Gravity, Urine: 1.01 (ref 1.000–1.030)
Total Protein, Urine: NEGATIVE
Urine Glucose: NEGATIVE
Urobilinogen, UA: 0.2 (ref 0.0–1.0)
pH: 8 (ref 5.0–8.0)

## 2022-01-23 LAB — TSH: TSH: 1.06 u[IU]/mL (ref 0.35–5.50)

## 2022-01-23 LAB — VITAMIN B12: Vitamin B-12: 871 pg/mL (ref 211–911)

## 2022-01-23 MED ORDER — AMOXICILLIN 500 MG PO CAPS: 500.0000 mg | ORAL_CAPSULE | Freq: Three times a day (TID) | ORAL | 0 refills | Status: AC

## 2022-01-23 MED ORDER — FLUTICASONE PROPIONATE 50 MCG/ACT NA SUSP: 2.0000 | Freq: Every day | NASAL | 6 refills | Status: DC

## 2022-01-23 MED ORDER — METHYLPREDNISOLONE 4 MG PO TBPK: ORAL_TABLET | ORAL | 0 refills | Status: DC

## 2022-01-23 NOTE — Assessment & Plan Note (Signed)
?  We discussed age appropriate health related issues, including available/recomended screening tests and vaccinations. Labs were ordered to be later reviewed . All questions were answered. We discussed one or more of the following - seat belt use, use of sunscreen/sun exposure exercise, safe sex, fall risk reduction, second hand smoke exposure, firearm use and storage, seat belt use, a need for adhering to healthy diet and exercise. Labs were ordered.  All questions were answered.  Labs ordered XRT exposure from [place] (?degree) - they will research it Vit D, folic acid 

## 2022-01-23 NOTE — Progress Notes (Signed)
? ?Subjective:  ?Patient ID: Sonya [name], female    DOB: [birth year]  Age: 41 y.o. MRN: [id number] ? ?CC: No chief complaint on file. ? ? ?HPI ?Sonya [name] presents for well exam ?C/o cough x 2 wks, nasal d/c. Pt took Augmentin x 7 days, then stopped due to nausea ? ?Outpatient Medications Prior to Visit  ?Medication Sig Dispense Refill  ? benzonatate (TESSALON) 100 MG capsule Take 1-2 capsules (100-200 mg total) by mouth 3 (three) times daily as needed for cough. 60 capsule 0  ? levocetirizine (XYZAL) 5 MG tablet Take 1 tablet (5 mg total) by mouth every evening. 30 tablet 0  ? mupirocin ointment (BACTROBAN) 2 % Apply 1 application topically 2 (two) times daily. 30 g 2  ? promethazine-dextromethorphan (PROMETHAZINE-DM) 6.25-15 MG/5ML syrup Take 5 mLs by mouth 3 (three) times daily as needed for cough. 200 mL 0  ? pseudoephedrine (SUDAFED) 60 MG tablet Take 1 tablet (60 mg total) by mouth every 8 (eight) hours as needed for congestion. 30 tablet 0  ? sulfamethoxazole-trimethoprim (BACTRIM DS) 800-160 MG tablet Take 1 tablet by mouth 2 (two) times daily. 14 tablet 0  ? amoxicillin-clavulanate (AUGMENTIN) 875-125 MG tablet Take 1 tablet by mouth 2 (two) times daily. 14 tablet 0  ? LORazepam (ATIVAN) 1 MG tablet Take 1 tablet (1 mg total) by mouth 2 (two) times daily as needed for up to 2 doses for anxiety. (Patient not taking: Reported on 08/17/2021) 2 tablet 0  ? valACYclovir (VALTREX) 1000 MG tablet Take 1 tablet (1,000 mg total) by mouth 3 (three) times daily. (Patient not taking: Reported on 08/03/2021) 21 tablet 0  ? ?No facility-administered medications prior to visit.  ? ? ?ROS: ?Review of Systems  ?Constitutional:  Positive for fatigue. Negative for activity change, appetite change, chills and unexpected weight change.  ?HENT:  Positive for postnasal drip, rhinorrhea and sinus pressure. Negative for congestion and mouth sores.   ?Eyes:  Negative for visual disturbance.  ?Respiratory:  Positive for  cough. Negative for chest tightness.   ?Gastrointestinal:  Negative for abdominal pain, nausea and vomiting.  ?Genitourinary:  Negative for difficulty urinating, frequency and vaginal pain.  ?Musculoskeletal:  Negative for back pain and gait problem.  ?Skin:  Negative for pallor and rash.  ?Neurological:  Negative for dizziness, tremors, weakness, numbness and headaches.  ?Psychiatric/Behavioral:  Negative for confusion and sleep disturbance.   ? ?Objective:  ?BP 98/60 (BP Location: Left Arm, Patient Position: Sitting, Cuff Size: Large)   Pulse 78   Temp 98.5 ?F (36.9 ?C) (Oral)   Ht 5\' 10"  (1.778 m)   Wt 183 lb (83 kg)   SpO2 96%   BMI 26.26 kg/m?  ? ?BP Readings from Last 3 Encounters:  ?01/23/22 98/60  ?01/13/22 132/80  ?08/17/21 120/80  ? ? ?Wt Readings from Last 3 Encounters:  ?01/23/22 183 lb (83 kg)  ?08/17/21 185 lb 9.6 oz (84.2 kg)  ?08/03/21 187 lb (84.8 kg)  ? ? ?Physical Exam ?Constitutional:   ?   General: She is not in acute distress. ?   Appearance: Normal appearance. She is well-developed.  ?HENT:  ?   Head: Normocephalic.  ?   Right Ear: External ear normal.  ?   Left Ear: External ear normal.  ?   Nose: Congestion and rhinorrhea present.  ?Eyes:  ?   General:     ?   Right eye: No discharge.     ?   Left eye: No discharge.  ?  Conjunctiva/sclera: Conjunctivae normal.  ?   Pupils: Pupils are equal, round, and reactive to light.  ?Neck:  ?   Thyroid: No thyromegaly.  ?   Vascular: No JVD.  ?   Trachea: No tracheal deviation.  ?Cardiovascular:  ?   Rate and Rhythm: Normal rate and regular rhythm.  ?   Heart sounds: Normal heart sounds.  ?Pulmonary:  ?   Effort: No respiratory distress.  ?   Breath sounds: No stridor. No wheezing.  ?Abdominal:  ?   General: Bowel sounds are normal. There is no distension.  ?   Palpations: Abdomen is soft. There is no mass.  ?   Tenderness: There is no abdominal tenderness. There is no guarding or rebound.  ?Musculoskeletal:     ?   General: No tenderness.  ?    Cervical back: Normal range of motion and neck supple. No rigidity.  ?Lymphadenopathy:  ?   Cervical: No cervical adenopathy.  ?Skin: ?   Findings: No erythema or rash.  ?Neurological:  ?   Cranial Nerves: No cranial nerve deficit.  ?   Motor: No abnormal muscle tone.  ?   Coordination: Coordination normal.  ?   Deep Tendon Reflexes: Reflexes normal.  ?Psychiatric:     ?   Behavior: Behavior normal.     ?   Thought Content: Thought content normal.     ?   Judgment: Judgment normal.  ?Swollen nasal mucosa ? ?I spent 22 minutes in addition to time for CPX wellness examination in preparing to see the patient by review of recent labs, imaging and procedures, obtaining and reviewing separately obtained history, communicating with the patient, ordering medications, tests or procedures, and documenting clinical information in the EHR including the differential diagnosis, treatment, and any further evaluation and other management of cough, URI, fatigue.  ?  ?  ? ? ? ?Lab Results  ?Component Value Date  ? WBC 5.7 01/19/2021  ? HGB 14.9 01/19/2021  ? HCT 42.7 01/19/2021  ? PLT 224.0 01/19/2021  ? GLUCOSE 94 01/19/2021  ? CHOL 215 (H) 01/19/2021  ? TRIG 95.0 01/19/2021  ? HDL 55.90 01/19/2021  ? LDLCALC 140 (H) 01/19/2021  ? ALT 20 01/19/2021  ? AST 14 01/19/2021  ? NA 140 01/19/2021  ? K 4.3 01/19/2021  ? CL 104 01/19/2021  ? CREATININE 0.65 01/19/2021  ? BUN 9 01/19/2021  ? CO2 30 01/19/2021  ? TSH 1.16 01/19/2021  ? ? ?No results found. ? ?Assessment & Plan:  ? ?Problem List Items Addressed This Visit   ? ? Well adult exam - Primary  ?   ?We discussed age appropriate health related issues, including available/recomended screening tests and vaccinations. Labs were ordered to be later reviewed . All questions were answered. We discussed one or more of the following - seat belt use, use of sunscreen/sun exposure exercise, safe sex, fall risk reduction, second hand smoke exposure, firearm use and storage, seat belt use, a need  for adhering to healthy diet and exercise. ?Labs were ordered.  All questions were answered. ? ?Labs ordered ?XRT exposure from [place] (?degree) - they will research it ?Vit D, folic acid ?  ?  ? Relevant Orders  ? TSH  ? Urinalysis  ? CBC with Differential/Platelet  ? Lipid panel  ? Comprehensive metabolic panel  ? Vitamin B12  ? Cough  ?  New post-URI ?Rx - Amoxicillin, Flonase ?Medrol pack po if not better in 2 d ?  ?  ?  Fatigue  ?  Post URI ?  ?  ? Relevant Orders  ? TSH  ? Vitamin B12  ?  ? ? ?Meds ordered this encounter  ?Medications  ? amoxicillin (AMOXIL) 500 MG capsule  ?  Sig: Take 1 capsule (500 mg total) by mouth 3 (three) times daily for 10 days.  ?  Dispense:  30 capsule  ?  Refill:  0  ? methylPREDNISolone (MEDROL DOSEPAK) 4 MG TBPK tablet  ?  Sig: As directed  ?  Dispense:  21 tablet  ?  Refill:  0  ? fluticasone (FLONASE) 50 MCG/ACT nasal spray  ?  Sig: Place 2 sprays into both nostrils daily.  ?  Dispense:  16 g  ?  Refill:  6  ?  ? ? ?Follow-up: Return in about 1 year (around 01/24/2023) for Wellness Exam. ? ?Walker Kehr, MD ?

## 2022-01-23 NOTE — Assessment & Plan Note (Signed)
Post-URI 

## 2022-01-23 NOTE — Assessment & Plan Note (Addendum)
New post-URI ?Rx - Amoxicillin, Flonase ?Medrol pack po if not better in 2 d ?

## 2022-01-29 NOTE — Progress Notes (Signed)
Subjective: ?41 year old female presents the office today with an interpreter for evaluation of reoccurrence of ingrown toenail right big toe.  She is noticed some swelling or redness.  She did complete the antibiotics which helped some.  She does not see any drainage or pus.  Still gets tenderness on the corner.  No injuries that she reports. ? ?Objective: ?AAO x3, NAD ?DP/PT pulses palpable bilaterally, CRT less than 3 seconds ?Protective sensation intact with Dorann Ou monofilament ?Incurvation present along the right lateral nail border.  There is mild reoccurrence of ingrown toenail on the proximal nail fold but there is localized edema and erythema to this area but there is no drainage or pus.  No fluctuance or crepitation.  There is no malodor. ?No pain with calf compression, swelling, warmth, erythema ? ?Assessment: ?Recurrent ingrown toenail right hallux lateral nail border ? ?Plan: ?-All treatment options discussed with the patient including all alternatives, risks, complications.  ?-At this point we discussed different treatment options.  Discussed chemical matricectomy with sodium hydroxide.  Also discussed surgical excision, [place].  We will proceed with this today and hydroxide.  She is going to let me know when she is available to schedule this.  In the meantime continue soaking Epsom salts and antibiotic ointment dressing changes daily. ?-Patient encouraged to call the office with any questions, concerns, change in symptoms.  ? ?Vivi Barrack DPM ? ?

## 2022-08-19 IMAGING — DX DG CERVICAL SPINE COMPLETE 4+V
5 series · 5 of 5 positions shown · non-contrast
Comparison: None.

CLINICAL DATA: Posterior neck pain

EXAM:
CERVICAL SPINE - COMPLETE 4+ VIEW

[c-spine lat]
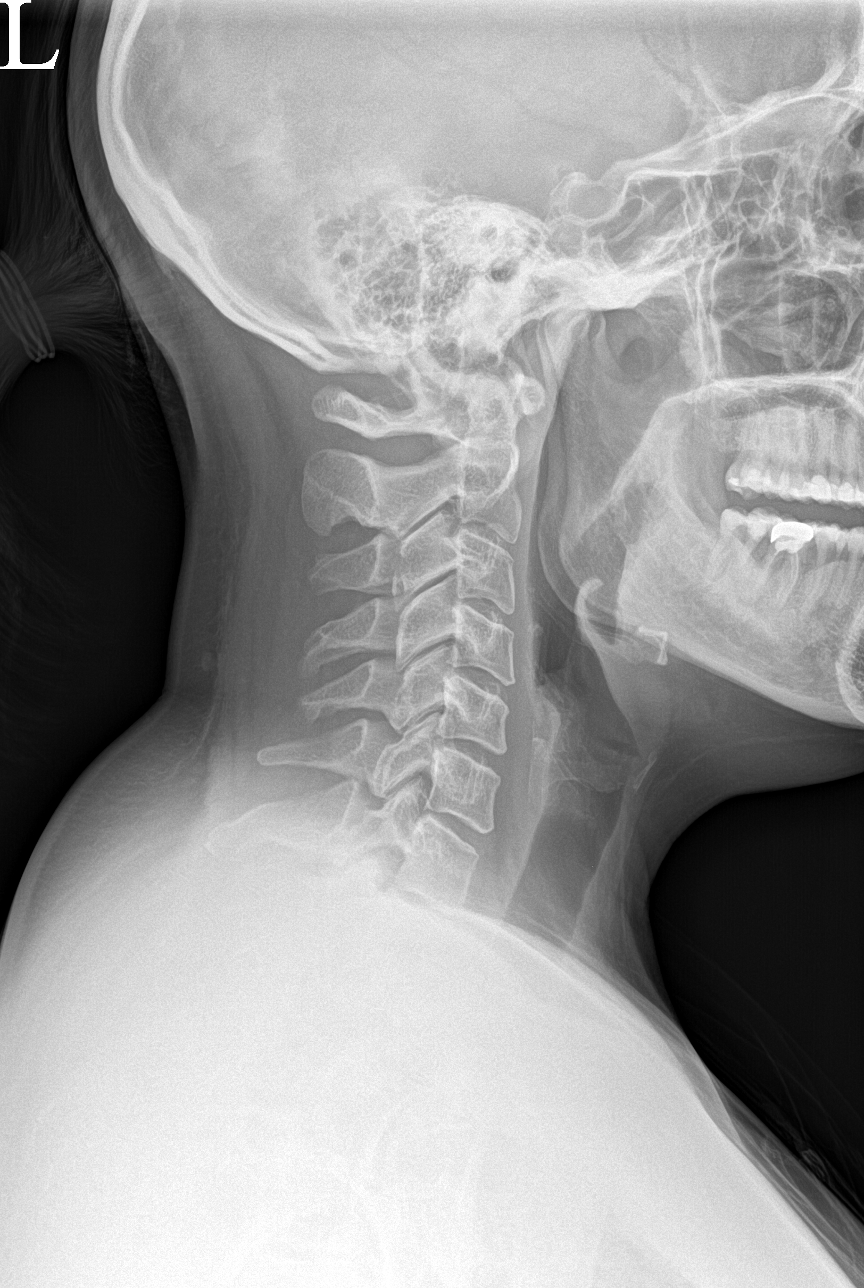

[c-spine obl (1 of 2)]
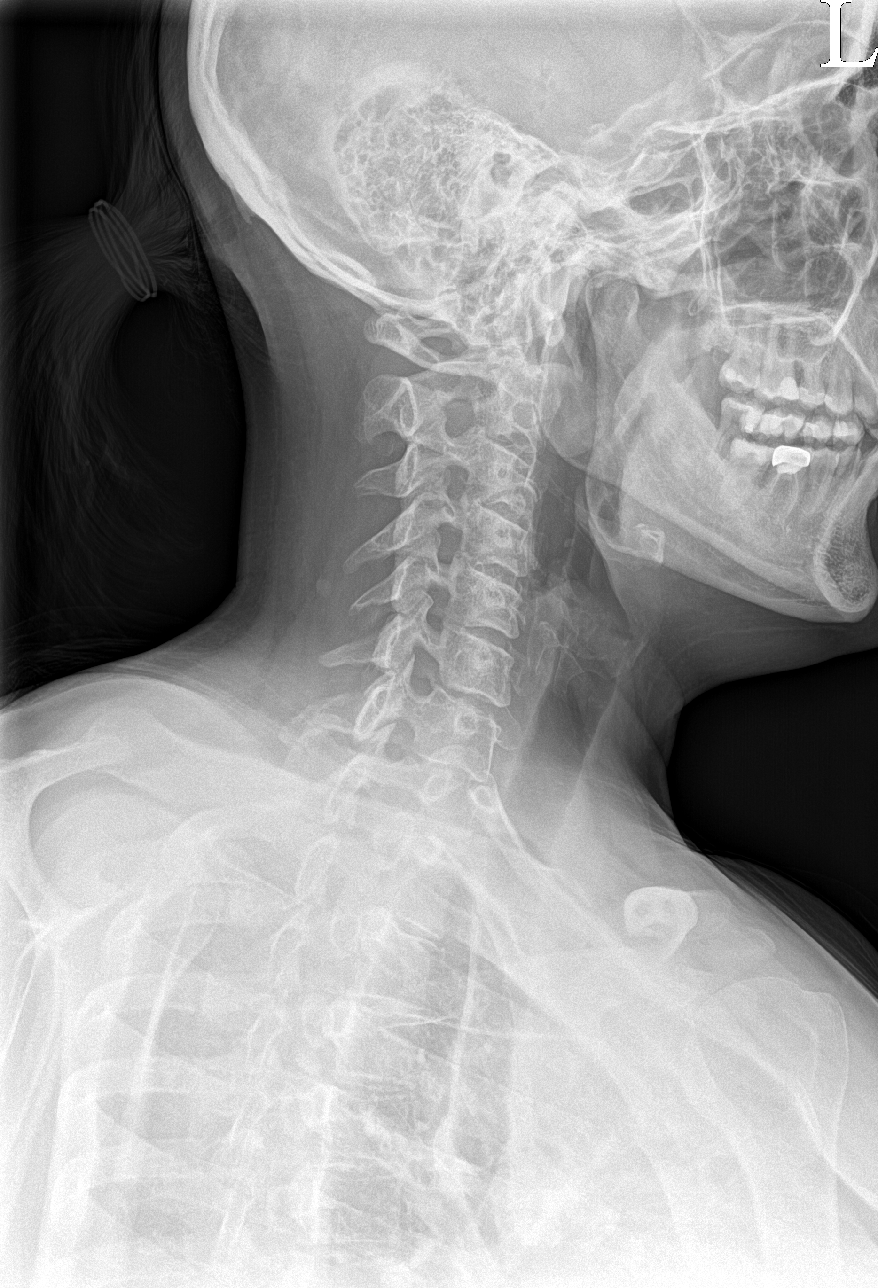

[c-spine obl (2 of 2)]
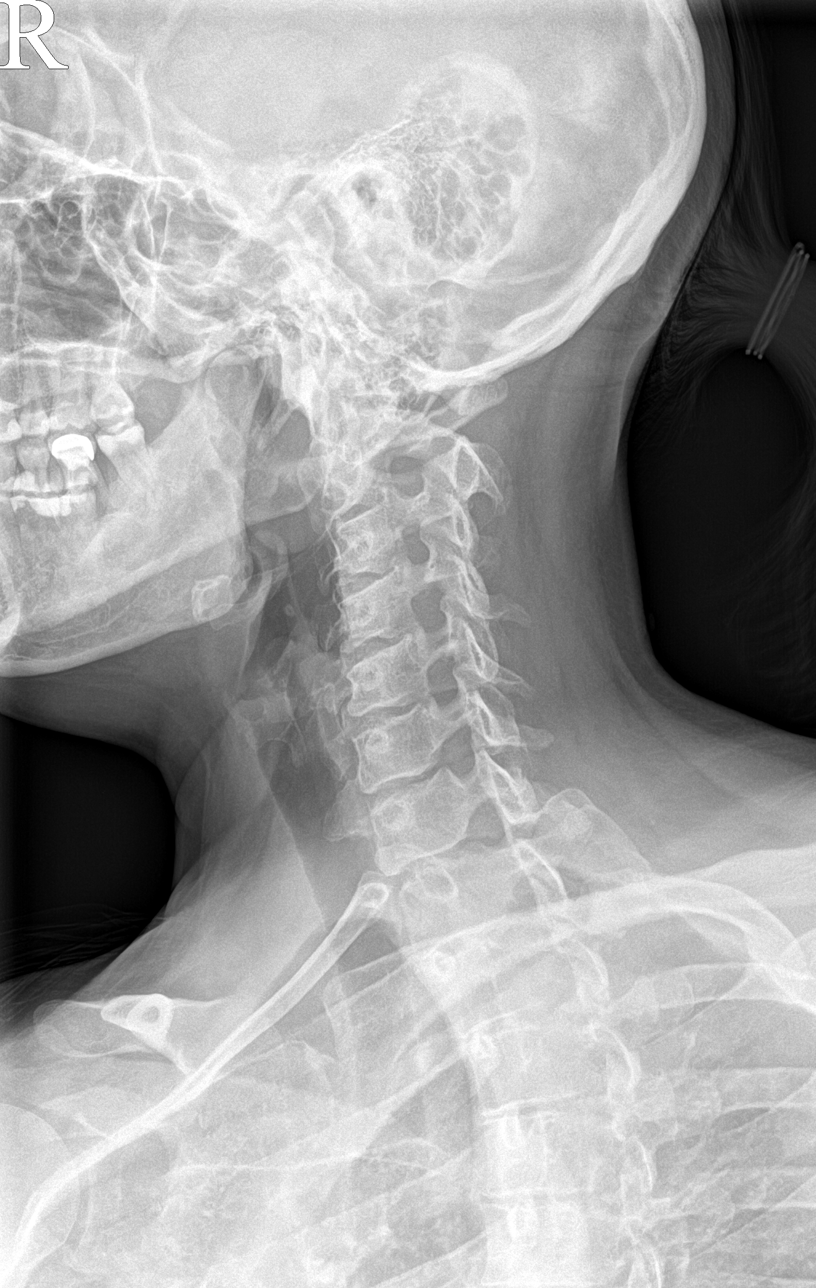

[c-spine ap]
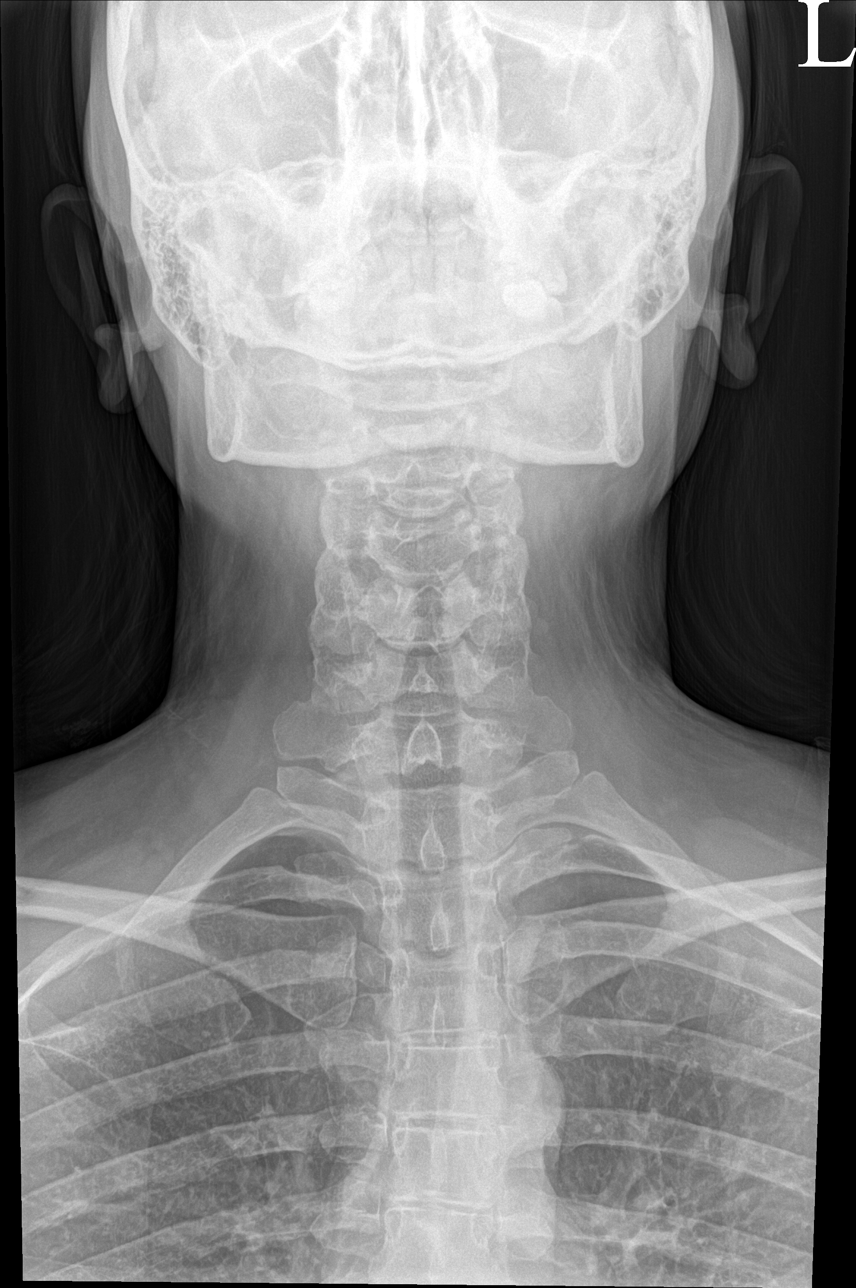

[c-spine open mouth]
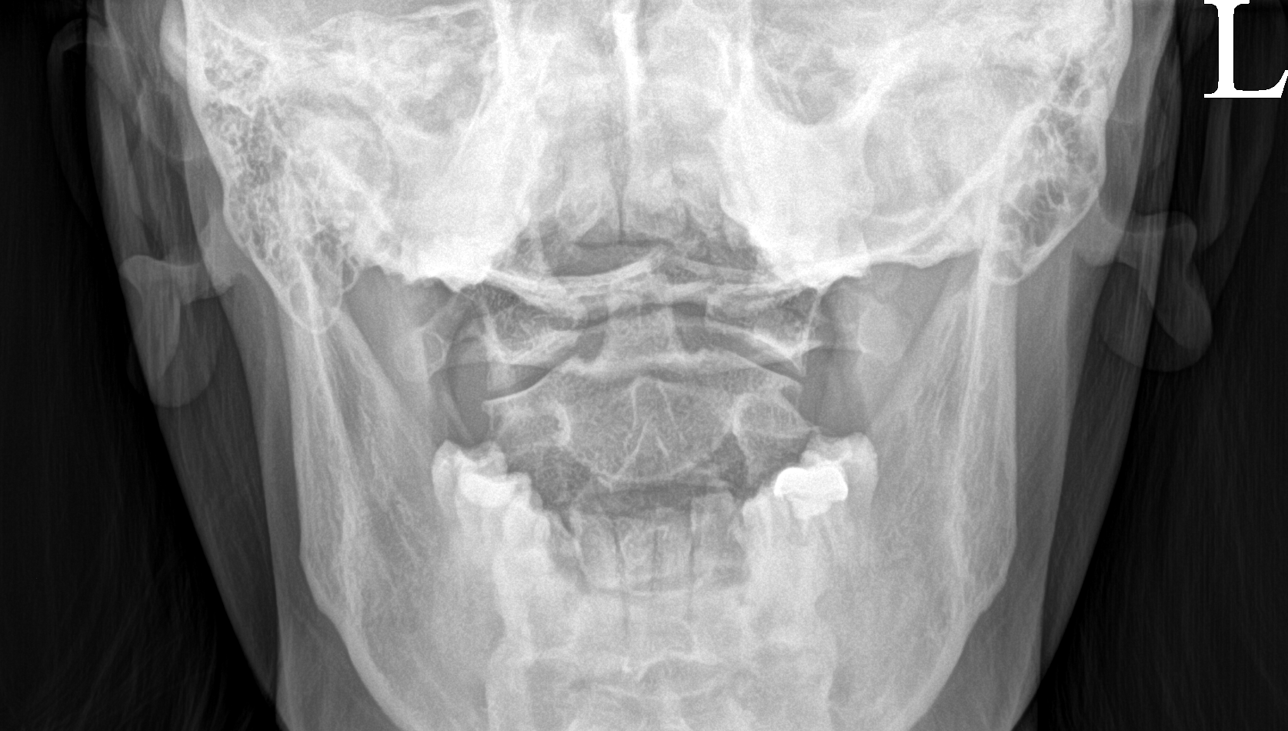

[5 of 5 positions shown; findings below may reference images not displayed]

FINDINGS: There is no evidence of cervical spine fracture or prevertebral soft
tissue swelling. Alignment is normal. No other significant bone
abnormalities are identified.
IMPRESSION: Negative cervical spine radiographs.

## 2022-08-19 IMAGING — DX DG THORACIC SPINE 2V
2 series · 2 of 2 positions shown · non-contrast
Comparison: None.

CLINICAL DATA: Neck and upper back pain

EXAM:
THORACIC SPINE 2 VIEWS

[t-spine ap]
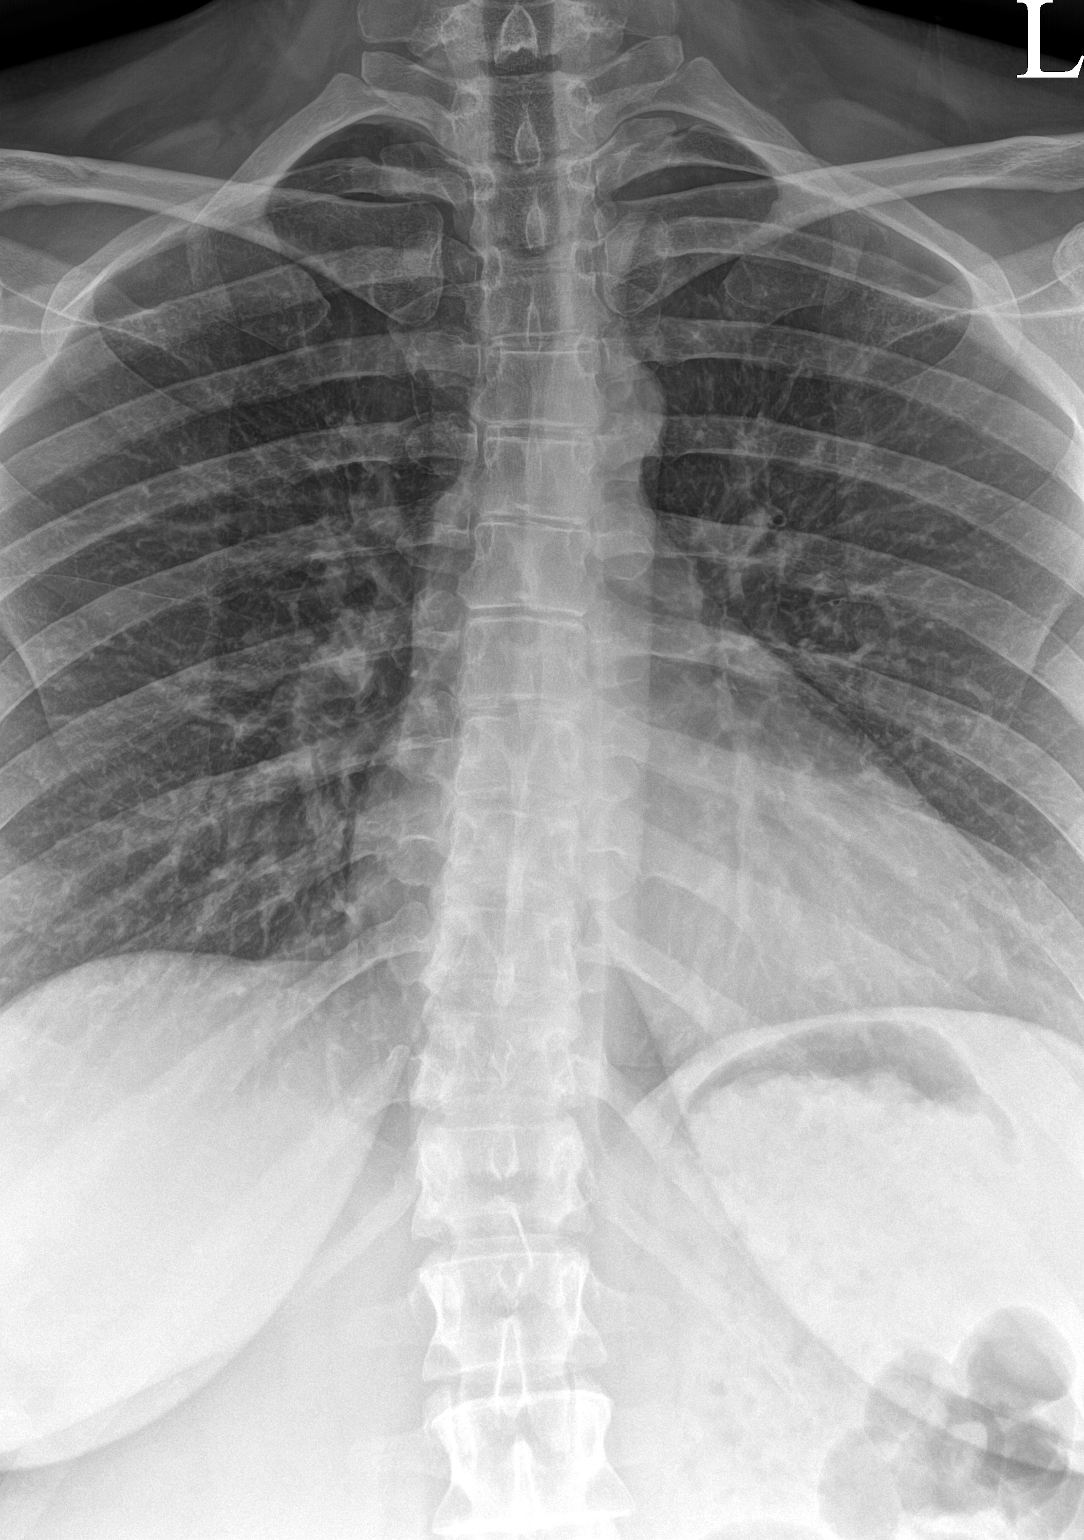

[t-spine lat]
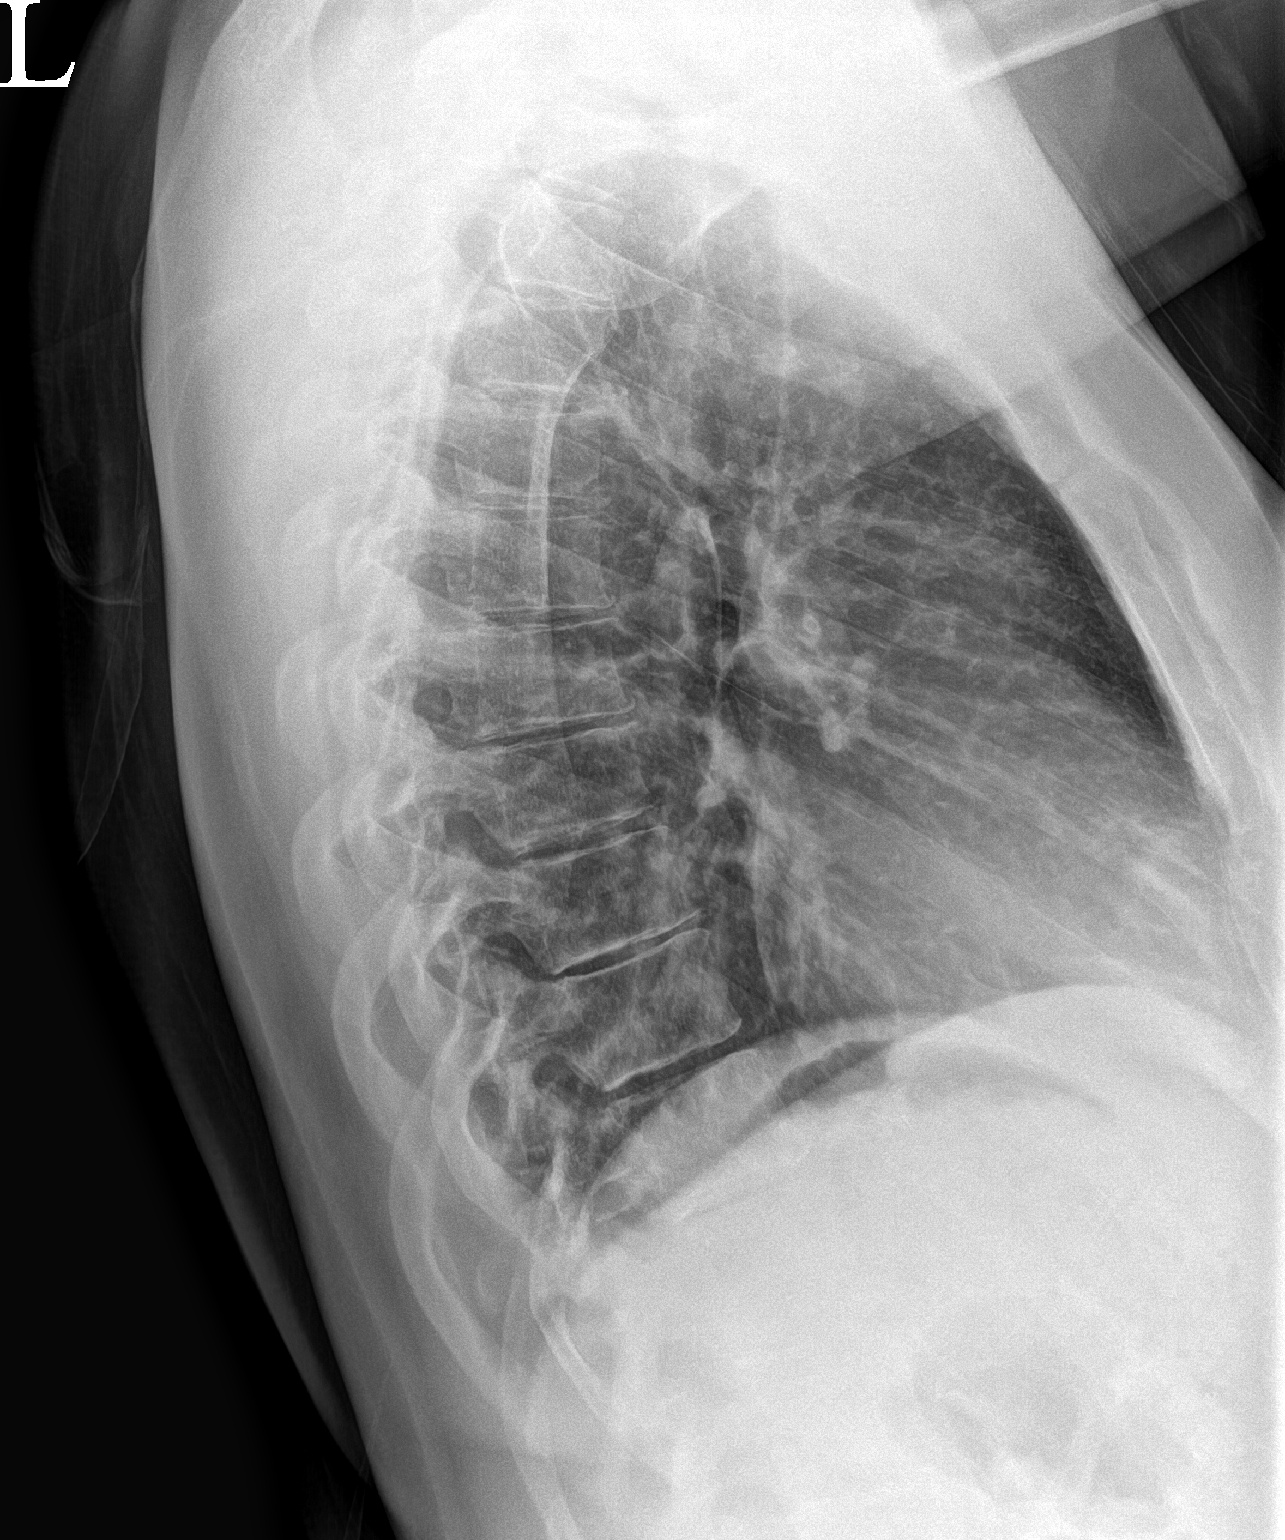

[2 of 2 positions shown; findings below may reference images not displayed]

FINDINGS: Mild scoliosis of the lower thoracic spine. Sagittal alignment is
normal. Minimal degenerative osteophyte
IMPRESSION: Mild scoliosis and degenerative change

## 2022-12-25 ENCOUNTER — Telehealth: Payer: Self-pay

## 2022-12-25 DIAGNOSIS — I83811 Varicose veins of right lower extremities with pain: Secondary | ICD-10-CM

## 2022-12-25 NOTE — Telephone Encounter (Signed)
Pt's husband, Vilinda Flake, called stating that the pt was having some issues with her RLE. She is c/o pain and a burning sensation.  Reviewed pt's chart, returned call for clarification, two identifiers used. Pt denies any open wounds or sores. Appts scheduled for [place] and [place] for first available on Dr. Donzetta Matters office day. Confirmed understanding.

## 2023-01-15 NOTE — Progress Notes (Unsigned)
[facility] [place]     History of Present Illness  Sonya [name] is a 42 y.o. female who presents with chief complaint: burning pain.    She has history right anterior excessively saphenous vein ablation and stab phlebectomy.   She was seen in follow up 08/17/21 and at that time she was having some pain and bruising along the right medial aspect of her upper and lower leg. She also had a palpable lump in the right groin which is painful. She is taking ibuprofen. She continues to wear her thigh-high stockings.    She has been wearing her thigh high compression daily.  She has noted heaviness and burning pain in the right thigh to the lower leg.  She denies symptoms of claudication, rest pain or non healing wounds.  She states that the achy pain seems to increase with her monthly cycle as well and she increases the use of Ibuprofen.  She is scared and tearful to find out what is wrong.    She does not speak english and an interpreter was used.    Past Medical History:  Diagnosis Date   Varicose veins of leg with pain, right     No past surgical history on file.  Social History   Socioeconomic History   Marital status: Married    Spouse name: Not on file   Number of children: Not on file   Years of education: Not on file   Highest education level: Not on file  Occupational History   Not on file  Tobacco Use   Smoking status: Never   Smokeless tobacco: Never  Vaping Use   Vaping Use: Never used  Substance and Sexual Activity   Alcohol use: No   Drug use: No   Sexual activity: Yes    Birth control/protection: None  Other Topics Concern   Not on file  Social History Narrative   Not on file   Social Determinants of Health   Financial Resource Strain: Not on file  Food Insecurity: Not on file  Transportation Needs: Not on file  Physical Activity: Not on file  Stress: Not on file  Social Connections: Not on file  Intimate Partner Violence: Not  on file    Family History  Problem Relation Age of Onset   Hypertension Father    Glaucoma Father     Current Outpatient Medications on File Prior to Visit  Medication Sig Dispense Refill   benzonatate (TESSALON) 100 MG capsule Take 1-2 capsules (100-200 mg total) by mouth 3 (three) times daily as needed for cough. 60 capsule 0   fluticasone (FLONASE) 50 MCG/ACT nasal spray Place 2 sprays into both nostrils daily. 16 g 6   levocetirizine (XYZAL) 5 MG tablet Take 1 tablet (5 mg total) by mouth every evening. 30 tablet 0   methylPREDNISolone (MEDROL DOSEPAK) 4 MG TBPK tablet As directed 21 tablet 0   mupirocin ointment (BACTROBAN) 2 % Apply 1 application topically 2 (two) times daily. 30 g 2   promethazine-dextromethorphan (PROMETHAZINE-DM) 6.25-15 MG/5ML syrup Take 5 mLs by mouth 3 (three) times daily as needed for cough. 200 mL 0   pseudoephedrine (SUDAFED) 60 MG tablet Take 1 tablet (60 mg total) by mouth every 8 (eight) hours as needed for congestion. 30 tablet 0   sulfamethoxazole-trimethoprim (BACTRIM DS) 800-160 MG tablet Take 1 tablet by mouth 2 (two) times daily. 14 tablet 0   valACYclovir (VALTREX) 1000 MG tablet Take 1 tablet (1,000 mg total) by  mouth 3 (three) times daily. 21 tablet 0   LORazepam (ATIVAN) 1 MG tablet Take 1 tablet (1 mg total) by mouth 2 (two) times daily as needed for up to 2 doses for anxiety. (Patient not taking: Reported on 08/17/2021) 2 tablet 0   No current facility-administered medications on file prior to visit.    Allergies as of 01/16/2023 - Review Complete 01/16/2023  Allergen Reaction Noted   Augmentin [amoxicillin-pot clavulanate]  01/23/2022   Kefzol [cefazolin] Other (See Comments) 11/08/2016     ROS:   General:  No weight loss, Fever, chills  HEENT: No recent headaches, no nasal bleeding, no visual changes, no sore throat  Neurologic: No dizziness, blackouts, seizures. No recent symptoms of stroke or mini- stroke. No recent episodes of  slurred speech, or temporary blindness.  Cardiac: No recent episodes of chest pain/pressure, no shortness of breath at rest.  No shortness of breath with exertion.  Denies history of atrial fibrillation or irregular heartbeat  Vascular: No history of rest pain in feet.  No history of claudication.  No history of non-healing ulcer, No history of DVT   Pulmonary: No home oxygen, no productive cough, no hemoptysis,  No asthma or wheezing  Musculoskeletal:  [ ]  Arthritis, [ ]  Low back pain,  [ ]  Joint pain  Hematologic:No history of hypercoagulable state.  No history of easy bleeding.  No history of anemia  Gastrointestinal: No hematochezia or melena,  No gastroesophageal reflux, no trouble swallowing  Urinary: [ ]  chronic Kidney disease, [ ]  on HD - [ ]  MWF or [ ]  TTHS, [ ]  Burning with urination, [ ]  Frequent urination, [ ]  Difficulty urinating;   Skin: No rashes  Psychological: No history of anxiety,  No history of depression  Physical Examination  Vitals:   01/16/23 1429  BP: 116/77  Pulse: 91  Resp: 20  Temp: (!) 97.5 F (36.4 C)  TempSrc: Temporal  SpO2: 99%  Weight: 186 lb 11.2 oz (84.7 kg)  Height: 5\' 10"  (1.778 m)    Body mass index is 26.79 kg/m.  General:  Alert and oriented, no acute distress HEENT: Normal Neck: No bruit or JVD Pulmonary: Clear to auscultation bilaterally Cardiac: Regular Rate and Rhythm without murmur Abdomen: Soft, non-tender, non-distended, no mass, no scars Skin: No rash, no exterior veins/varicosities is visiable Extremity Pulses:  2+ radial,  femoral, anterior tibial pulses bilaterally Musculoskeletal: No deformity or edema  Neurologic: Upper and lower extremity motor 5/5 and symmetric  DATA: Venous Reflux Times  +------------------+---------+------+-----------+------------+-------------  ----+  RIGHT            Reflux NoRefluxReflux TimeDiameter cmsComments                                        Yes                                              +------------------+---------+------+-----------+------------+-------------  ----+  CFV              no                                                        +------------------+---------+------+-----------+------------+-------------  ----+  FV mid            no                                                        +------------------+---------+------+-----------+------------+-------------  ----+  Popliteal        no                                                        +------------------+---------+------+-----------+------------+-------------  ----+  GSV at [facility]                  yes    >500 ms      0.95                        +------------------+---------+------+-----------+------------+-------------  ----+  GSV prox thigh              yes    >500 ms      0.44                        +------------------+---------+------+-----------+------------+-------------  ----+  GSV mid thigh               yes    >500 ms      0.39                        +------------------+---------+------+-----------+------------+-------------  ----+  GSV dist thigh                                          out of  fascia      +------------------+---------+------+-----------+------------+-------------  ----+  GSV at knee                                             out of  fascia      +------------------+---------+------+-----------+------------+-------------  ----+  GSV prox calf               yes    >500 ms      0.39    branch  feeding                                                             varicosities  of                                                            the proximal  calf  +------------------+---------+------+-----------+------------+-------------  ----+  SSV Pop Fossa  no                            0.21                         +------------------+---------+------+-----------+------------+-------------  ----+  anterior accessoryno                                    prior                                                                        ablation/strippin                                                         g                   +------------------+---------+------+-----------+------------+-------------  ----+       Summary:  Right:  - No evidence of deep vein thrombosis from the common femoral through the  popliteal veins.  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  - The deep venous system is competent.  - The great saphenous vein is not competent.  - The small saphenous vein is competent.   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 Laser ablation and stab phlebectomy.  She continues to wear thigh high compression since her procedure over the last 6 months due to her returned symptoms of achy burning pain with edema on the right LE.  He symptoms have returned with evidence on reflux study of the SFJ and GSV reflux with size > 0.4.  She has palpable pedal pulses and no symptoms of ischemia.   She is not at risk of limb loss.    I will schedule her to follow up in 3-4 weeks with Dr. Donzetta Matters to discuss laser ablation of the GSV.  In the mean time she will continue to wear thigh high compress daily, elevation and exercise.     Roxy Horseman PA-C [facility] [place] Office: [phone]   MD in clinic [place]

## 2023-01-16 ENCOUNTER — Ambulatory Visit (HOSPITAL_COMMUNITY)
Admission: RE | Admit: 2023-01-16 | Discharge: 2023-01-16 | Disposition: A | Discharge status: Home / Self Care | Payer: Managed Care, Other (non HMO) | Source: Ambulatory Visit | Attending: Vascular Surgery | Admitting: Vascular Surgery

## 2023-01-16 ENCOUNTER — Ambulatory Visit (INDEPENDENT_AMBULATORY_CARE_PROVIDER_SITE_OTHER): Payer: Managed Care, Other (non HMO) | Admitting: Physician Assistant

## 2023-01-16 ENCOUNTER — Encounter: Payer: Self-pay | Admitting: Physician Assistant

## 2023-01-16 VITALS — BP 116/77 | HR 91 | Temp 97.5°F | Resp 20 | Ht 70.0 in | Wt 186.7 lb

## 2023-01-16 DIAGNOSIS — I872 Venous insufficiency (chronic) (peripheral): Secondary | ICD-10-CM

## 2023-01-16 DIAGNOSIS — I83811 Varicose veins of right lower extremities with pain: Secondary | ICD-10-CM

## 2023-01-16 DIAGNOSIS — I8393 Asymptomatic varicose veins of bilateral lower extremities: Secondary | ICD-10-CM

## 2023-01-31 ENCOUNTER — Ambulatory Visit (INDEPENDENT_AMBULATORY_CARE_PROVIDER_SITE_OTHER): Payer: Managed Care, Other (non HMO) | Admitting: Internal Medicine

## 2023-01-31 ENCOUNTER — Encounter: Payer: Self-pay | Admitting: Internal Medicine

## 2023-01-31 VITALS — BP 112/70 | HR 79 | Temp 99.0°F | Ht 70.0 in | Wt 183.0 lb

## 2023-01-31 DIAGNOSIS — R1013 Epigastric pain: Secondary | ICD-10-CM | POA: Diagnosis not present

## 2023-01-31 DIAGNOSIS — I83811 Varicose veins of right lower extremities with pain: Secondary | ICD-10-CM

## 2023-01-31 DIAGNOSIS — R1011 Right upper quadrant pain: Secondary | ICD-10-CM

## 2023-01-31 DIAGNOSIS — Z0000 Encounter for general adult medical examination without abnormal findings: Secondary | ICD-10-CM

## 2023-01-31 LAB — CBC WITH DIFFERENTIAL/PLATELET
Basophils Absolute: 0 10*3/uL (ref 0.0–0.1)
Basophils Relative: 0.5 % (ref 0.0–3.0)
Eosinophils Absolute: 0.1 10*3/uL (ref 0.0–0.7)
Eosinophils Relative: 0.7 % (ref 0.0–5.0)
HCT: 36.7 % (ref 36.0–46.0)
Hemoglobin: 12.3 g/dL (ref 12.0–15.0)
Lymphocytes Relative: 31.2 % (ref 12.0–46.0)
Lymphs Abs: 2.1 10*3/uL (ref 0.7–4.0)
MCHC: 33.4 g/dL (ref 30.0–36.0)
MCV: 72.4 fl — ABNORMAL LOW (ref 78.0–100.0)
Monocytes Absolute: 0.6 10*3/uL (ref 0.1–1.0)
Monocytes Relative: 8.6 % (ref 3.0–12.0)
Neutro Abs: 4.1 10*3/uL (ref 1.4–7.7)
Neutrophils Relative %: 59 % (ref 43.0–77.0)
Platelets: 319 10*3/uL (ref 150.0–400.0)
RBC: 5.08 Mil/uL (ref 3.87–5.11)
RDW: 15.5 % (ref 11.5–15.5)
WBC: 6.9 10*3/uL (ref 4.0–10.5)

## 2023-01-31 LAB — TSH: TSH: 0.79 u[IU]/mL (ref 0.35–5.50)

## 2023-01-31 LAB — LIPID PANEL
Cholesterol: 216 mg/dL — ABNORMAL HIGH (ref 0–200)
HDL: 58.9 mg/dL (ref >39.00)
LDL Cholesterol: 142 mg/dL — ABNORMAL HIGH (ref 0–99)
NonHDL: 157.53
Total CHOL/HDL Ratio: 4
Triglycerides: 77 mg/dL (ref 0.0–149.0)
VLDL: 15.4 mg/dL (ref 0.0–40.0)

## 2023-01-31 LAB — COMPREHENSIVE METABOLIC PANEL
ALT: 12 U/L (ref 0–35)
AST: 14 U/L (ref 0–37)
Albumin: 4.6 g/dL (ref 3.5–5.2)
Alkaline Phosphatase: 37 U/L — ABNORMAL LOW (ref 39–117)
BUN: 12 mg/dL (ref 6–23)
CO2: 27 mEq/L (ref 19–32)
Calcium: 9.5 mg/dL (ref 8.4–10.5)
Chloride: 104 mEq/L (ref 96–112)
Creatinine, Ser: 0.62 mg/dL (ref 0.40–1.20)
GFR: 111.54 mL/min (ref >60.00)
Glucose, Bld: 86 mg/dL (ref 70–99)
Potassium: 4.2 mEq/L (ref 3.5–5.1)
Sodium: 138 mEq/L (ref 135–145)
Total Bilirubin: 0.6 mg/dL (ref 0.2–1.2)
Total Protein: 8 g/dL (ref 6.0–8.3)

## 2023-01-31 LAB — URINALYSIS
Bilirubin Urine: NEGATIVE
Leukocytes,Ua: NEGATIVE
Nitrite: NEGATIVE
Specific Gravity, Urine: 1.025 (ref 1.000–1.030)
Urine Glucose: NEGATIVE
Urobilinogen, UA: 0.2 (ref 0.0–1.0)
pH: 6 (ref 5.0–8.0)

## 2023-01-31 MED ORDER — LORAZEPAM 0.5 MG PO TABS: 0.5000 mg | ORAL_TABLET | Freq: Two times a day (BID) | ORAL | 1 refills | Status: DC | PRN

## 2023-01-31 NOTE — Assessment & Plan Note (Addendum)
New HH vs MSK pain Abd [place] Labs May need CT, GI ref

## 2023-01-31 NOTE — Progress Notes (Signed)
Subjective:  Patient ID: Sonya [name], female    DOB: 1980/12/02  Age: 42 y.o. MRN: [id number]  CC: Annual Exam and Abdominal Pain (Right side under ribs (acute))   HPI Valia Thurner presents for RUQ pain - seconds and epigastric cramps, worse w/bending over x 6 months  Outpatient Medications Prior to Visit  Medication Sig Dispense Refill   benzonatate (TESSALON) 100 MG capsule Take 1-2 capsules (100-200 mg total) by mouth 3 (three) times daily as needed for cough. 60 capsule 0   fluticasone (FLONASE) 50 MCG/ACT nasal spray Place 2 sprays into both nostrils daily. 16 g 6   levocetirizine (XYZAL) 5 MG tablet Take 1 tablet (5 mg total) by mouth every evening. 30 tablet 0   methylPREDNISolone (MEDROL DOSEPAK) 4 MG TBPK tablet As directed 21 tablet 0   mupirocin ointment (BACTROBAN) 2 % Apply 1 application topically 2 (two) times daily. 30 g 2   promethazine-dextromethorphan (PROMETHAZINE-DM) 6.25-15 MG/5ML syrup Take 5 mLs by mouth 3 (three) times daily as needed for cough. 200 mL 0   pseudoephedrine (SUDAFED) 60 MG tablet Take 1 tablet (60 mg total) by mouth every 8 (eight) hours as needed for congestion. 30 tablet 0   sulfamethoxazole-trimethoprim (BACTRIM DS) 800-160 MG tablet Take 1 tablet by mouth 2 (two) times daily. 14 tablet 0   valACYclovir (VALTREX) 1000 MG tablet Take 1 tablet (1,000 mg total) by mouth 3 (three) times daily. 21 tablet 0   LORazepam (ATIVAN) 1 MG tablet Take 1 tablet (1 mg total) by mouth 2 (two) times daily as needed for up to 2 doses for anxiety. (Patient not taking: Reported on 08/17/2021) 2 tablet 0   No facility-administered medications prior to visit.    ROS: Review of Systems  Constitutional:  Negative for activity change, appetite change, chills, fatigue and unexpected weight change.  HENT:  Negative for congestion, mouth sores and sinus pressure.   Eyes:  Negative for visual disturbance.  Respiratory:  Negative for cough and chest tightness.    Gastrointestinal:  Negative for abdominal pain and nausea.  Genitourinary:  Negative for difficulty urinating, frequency and vaginal pain.  Musculoskeletal:  Negative for back pain and gait problem.  Skin:  Negative for pallor and rash.  Neurological:  Negative for dizziness, tremors, weakness, numbness and headaches.  Psychiatric/Behavioral:  Negative for confusion and sleep disturbance.     Objective:  BP 112/70   Pulse 79   Temp 99 F (37.2 C) (Oral)   Ht 5\' 10"  (1.778 m)   Wt 183 lb (83 kg)   SpO2 96%   BMI 26.26 kg/m   BP Readings from Last 3 Encounters:  02/06/23 116/78  01/31/23 112/70  01/16/23 116/77    Wt Readings from Last 3 Encounters:  02/06/23 184 lb (83.5 kg)  01/31/23 183 lb (83 kg)  01/16/23 186 lb 11.2 oz (84.7 kg)    Physical Exam Constitutional:      General: She is not in acute distress.    Appearance: She is well-developed.  HENT:     Head: Normocephalic.     Right Ear: External ear normal.     Left Ear: External ear normal.     Nose: Nose normal.  Eyes:     General:        Right eye: No discharge.        Left eye: No discharge.     Conjunctiva/sclera: Conjunctivae normal.     Pupils: Pupils are equal, round, and reactive to light.  Neck:     Thyroid: No thyromegaly.     Vascular: No JVD.     Trachea: No tracheal deviation.  Cardiovascular:     Rate and Rhythm: Normal rate and regular rhythm.     Heart sounds: Normal heart sounds.  Pulmonary:     Effort: No respiratory distress.     Breath sounds: No stridor. No wheezing.  Abdominal:     General: Bowel sounds are normal. There is no distension.     Palpations: Abdomen is soft. There is no mass.     Tenderness: There is no abdominal tenderness. There is no guarding or rebound.  Musculoskeletal:        General: No tenderness.     Cervical back: Normal range of motion and neck supple. No rigidity.  Lymphadenopathy:     Cervical: No cervical adenopathy.  Skin:    Findings: No  erythema or rash.  Neurological:     Cranial Nerves: No cranial nerve deficit.     Motor: No abnormal muscle tone.     Coordination: Coordination normal.     Deep Tendon Reflexes: Reflexes normal.  Psychiatric:        Behavior: Behavior normal.        Thought Content: Thought content normal.        Judgment: Judgment normal.    R leg varic veins are present  Lab Results  Component Value Date   WBC 5.5 01/23/2022   HGB 13.9 01/23/2022   HCT 41.0 01/23/2022   PLT 298.0 01/23/2022   GLUCOSE 91 01/23/2022   CHOL 208 (H) 01/23/2022   TRIG 114.0 01/23/2022   HDL 47.60 01/23/2022   LDLCALC 137 (H) 01/23/2022   ALT 15 01/23/2022   AST 13 01/23/2022   NA 141 01/23/2022   K 4.1 01/23/2022   CL 105 01/23/2022   CREATININE 0.59 01/23/2022   BUN 10 01/23/2022   CO2 29 01/23/2022   TSH 1.06 01/23/2022    VAS [place] LOWER EXTREMITY VENOUS REFLUX  Result Date: 01/16/2023  Lower Venous Reflux Study Patient Name:  Sonya [name]  Date of Exam:   01/16/2023 Medical Rec #: [id number]         Accession #:    [id number] Date of Birth: Feb 19, 1981         Patient Gender: F Patient Age:   42 years Exam Location:  [facility] Procedure:      VAS [place] LOWER EXTREMITY VENOUS REFLUX Referring Phys: Lemar Livings --------------------------------------------------------------------------------  Indications: Right lower extremity pain and sensation of lower leg discomfort. History of Right ASV ablation 08/03/2021  Performing Technologist: Dorthula Matas RVS, RCS  Examination Guidelines: A complete evaluation includes B-mode imaging, spectral Doppler, color Doppler, and power Doppler as needed of all accessible portions of each vessel. Bilateral testing is considered an integral part of a complete examination. Limited examinations for reoccurring indications may be performed as noted. The reflux portion of the exam is performed with the patient in reverse Trendelenburg. Significant venous reflux is  defined as >500 ms in the superficial venous system, and >1 second in the deep venous system.  Venous Reflux Times +------------------+---------+------+-----------+------------+-----------------+ RIGHT             Reflux NoRefluxReflux TimeDiameter cmsComments                                      Yes                                           +------------------+---------+------+-----------+------------+-----------------+  CFV               no                                                      +------------------+---------+------+-----------+------------+-----------------+ FV mid            no                                                      +------------------+---------+------+-----------+------------+-----------------+ Popliteal         no                                                      +------------------+---------+------+-----------+------------+-----------------+ GSV at SFJ                  yes    >500 ms      0.95                      +------------------+---------+------+-----------+------------+-----------------+ GSV prox thigh              yes    >500 ms      0.44                      +------------------+---------+------+-----------+------------+-----------------+ GSV mid thigh               yes    >500 ms      0.39                      +------------------+---------+------+-----------+------------+-----------------+ GSV dist thigh                                          out of fascia     +------------------+---------+------+-----------+------------+-----------------+ GSV at knee                                             out of fascia     +------------------+---------+------+-----------+------------+-----------------+ GSV prox calf               yes    >500 ms      0.39    branch feeding                                                            varicosities of  the proximal calf +------------------+---------+------+-----------+------------+-----------------+ SSV Pop Fossa     no                            0.21                      +------------------+---------+------+-----------+------------+-----------------+ anterior accessoryno                                    prior                                                                     ablation/strippin                                                         g                 +------------------+---------+------+-----------+------------+-----------------+   Summary: Right: - No evidence of deep vein thrombosis from the common femoral through the popliteal veins. 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 - The deep venous system is competent. - The great saphenous vein is not competent. - The small saphenous vein is competent.  *See table(s) above for measurements and observations. Electronically signed by Lemar Livings MD on 01/16/2023 at 3:03:33 PM.    Final     Assessment & Plan:   Problem List Items Addressed This Visit     Well adult exam     We discussed age appropriate health related issues, including available/recomended screening tests and vaccinations. Labs were ordered to be later reviewed . All questions were answered. We discussed one or more of the following - seat belt use, use of sunscreen/sun exposure exercise, safe sex, fall risk reduction, second hand smoke exposure, firearm use and storage, seat belt use, a need for adhering to healthy diet and exercise. Labs were ordered.  All questions were answered.  Labs ordered XRT exposure from [place] (?degree) - they will research it Vit D, folic acid      Relevant Orders   TSH   Urinalysis   CBC with Differential/Platelet   Lipid panel   Comprehensive metabolic panel   Epigastric pain - Primary    New HH vs MSK pain Abd [place] Labs May need CT, GI ref      Relevant Orders   US  Abdomen Complete   Varicose vein of leg    Worse per patient.  Right leg.  Follow-up with Vein clinic.      Other Visit Diagnoses     RUQ pain       Relevant Orders   US Abdomen Complete         Meds ordered this encounter  Medications   LORazepam (ATIVAN) 0.5 MG tablet    Sig: Take 1 tablet (0.5 mg total) by mouth 2 (two) times daily as needed for anxiety or sleep.    Dispense:  60 tablet    Refill:  1  Follow-up: Return in about 6 weeks (around 03/14/2023) for a follow-up visit.  Sonda Primes, MD

## 2023-01-31 NOTE — Assessment & Plan Note (Signed)
?  We discussed age appropriate health related issues, including available/recomended screening tests and vaccinations. Labs were ordered to be later reviewed . All questions were answered. We discussed one or more of the following - seat belt use, use of sunscreen/sun exposure exercise, safe sex, fall risk reduction, second hand smoke exposure, firearm use and storage, seat belt use, a need for adhering to healthy diet and exercise. ?Labs were ordered.  All questions were answered. ? ?Labs ordered ?XRT exposure from [place] (?degree) - they will research it ?Vit D, folic acid ?

## 2023-02-06 ENCOUNTER — Encounter: Payer: Self-pay | Admitting: Vascular Surgery

## 2023-02-06 ENCOUNTER — Ambulatory Visit (INDEPENDENT_AMBULATORY_CARE_PROVIDER_SITE_OTHER): Payer: Managed Care, Other (non HMO) | Admitting: Vascular Surgery

## 2023-02-06 VITALS — BP 116/78 | HR 70 | Temp 98.1°F | Resp 20 | Ht 70.0 in | Wt 184.0 lb

## 2023-02-06 DIAGNOSIS — I83899 Varicose veins of unspecified lower extremities with other complications: Secondary | ICD-10-CM | POA: Diagnosis not present

## 2023-02-06 NOTE — Progress Notes (Signed)
Patient ID: Sonya [name], female   DOB: [birth year], 41 y.o.   MRN: [id number]  Reason for Consult: Follow-up   Referred by Plotnikov, Georgina Quint, MD  Subjective:     HPI:  Sonya [name] is a 42 y.o. female with history of right anterior accessory saphenous vein ablation and stab phlebectomy between 10 and 20 of the medial thigh and leg.  She states over the past month she has developed pain in the right groin.  She also has some persistent small veins in the right medial leg and near the knee that she is concerned about.  She has been wearing compression stockings that are thigh-high but they do increase the pain in her right leg near the groin.  She has no swelling of the leg.  She remains very active and denies any traumatic or other injuries to the groin area and denies any bruising or palpable masses.  She is accompanied by her husband today.  All history obtained from [place] interpreter.  Past Medical History:  Diagnosis Date   Varicose veins of leg with pain, right    Family History  Problem Relation Age of Onset   Hypertension Father    Glaucoma Father    History reviewed. No pertinent surgical history.  Short Social History:  Social History   Tobacco Use   Smoking status: Never   Smokeless tobacco: Never  Substance Use Topics   Alcohol use: No    Allergies  Allergen Reactions   Augmentin [Amoxicillin-Pot Clavulanate]     nausea   Kefzol [Cefazolin] Other (See Comments)    Fever?    Current Outpatient Medications  Medication Sig Dispense Refill   benzonatate (TESSALON) 100 MG capsule Take 1-2 capsules (100-200 mg total) by mouth 3 (three) times daily as needed for cough. 60 capsule 0   fluticasone (FLONASE) 50 MCG/ACT nasal spray Place 2 sprays into both nostrils daily. 16 g 6   levocetirizine (XYZAL) 5 MG tablet Take 1 tablet (5 mg total) by mouth every evening. 30 tablet 0   LORazepam (ATIVAN) 0.5 MG tablet Take 1 tablet (0.5 mg total) by mouth 2 (two)  times daily as needed for anxiety or sleep. 60 tablet 1   methylPREDNISolone (MEDROL DOSEPAK) 4 MG TBPK tablet As directed 21 tablet 0   mupirocin ointment (BACTROBAN) 2 % Apply 1 application topically 2 (two) times daily. 30 g 2   promethazine-dextromethorphan (PROMETHAZINE-DM) 6.25-15 MG/5ML syrup Take 5 mLs by mouth 3 (three) times daily as needed for cough. 200 mL 0   pseudoephedrine (SUDAFED) 60 MG tablet Take 1 tablet (60 mg total) by mouth every 8 (eight) hours as needed for congestion. 30 tablet 0   sulfamethoxazole-trimethoprim (BACTRIM DS) 800-160 MG tablet Take 1 tablet by mouth 2 (two) times daily. 14 tablet 0   valACYclovir (VALTREX) 1000 MG tablet Take 1 tablet (1,000 mg total) by mouth 3 (three) times daily. 21 tablet 0   No current facility-administered medications for this visit.    Review of Systems  Constitutional:  Constitutional negative. HENT: HENT negative.  Eyes: Eyes negative.  Respiratory: Respiratory negative.  Cardiovascular: Cardiovascular negative.  GI: Gastrointestinal negative.  Musculoskeletal:       Right groin pain Skin:       Small veins that are irritating on her right medial leg and near the knee Neurological: Neurological negative. Hematologic: Hematologic/lymphatic negative.  Psychiatric: Psychiatric negative.        Objective:  Objective   Vitals:   02/06/23 1518  BP: 116/78  Pulse: 70  Resp: 20  Temp: 98.1 F (36.7 C)  SpO2: 97%  Weight: 184 lb (83.5 kg)  Height: 5\' 10"  (1.778 m)   Body mass index is 26.4 kg/m.  Physical Exam HENT:     Head: Normocephalic.     Nose: Nose normal.  Eyes:     Pupils: Pupils are equal, round, and reactive to light.  Cardiovascular:     Pulses: Normal pulses.  Pulmonary:     Effort: Pulmonary effort is normal.  Abdominal:     General: Abdomen is flat.  Musculoskeletal:     Right lower leg: No edema.     Left lower leg: No edema.     Comments: I evaluated her right lower extremity with  ultrasound I do not see any abnormalities in the right groin.  Just below the right knee and at the level of the right knee there are some reticular veins which are compressible.  Skin:    Capillary Refill: Capillary refill takes less than 2 seconds.  Neurological:     General: No focal deficit present.     Mental Status: She is alert.  Psychiatric:        Mood and Affect: Mood normal.        Thought Content: Thought content normal.        Judgment: Judgment normal.     Data: Venous Reflux Times  +------------------+---------+------+-----------+------------+-------------  ----+  RIGHT            Reflux NoRefluxReflux TimeDiameter cmsComments                                        Yes                                             +------------------+---------+------+-----------+------------+-------------  ----+  CFV              no                                                        +------------------+---------+------+-----------+------------+-------------  ----+  FV mid            no                                                        +------------------+---------+------+-----------+------------+-------------  ----+  Popliteal        no                                                        +------------------+---------+------+-----------+------------+-------------  ----+  GSV at [facility]                  yes    >500 ms      0.95                        +------------------+---------+------+-----------+------------+-------------  ----+  GSV prox thigh              yes    >500 ms      0.44                        +------------------+---------+------+-----------+------------+-------------  ----+  GSV mid thigh               yes    >500 ms      0.39                        +------------------+---------+------+-----------+------------+-------------  ----+  GSV dist thigh                                          out  of  fascia      +------------------+---------+------+-----------+------------+-------------  ----+  GSV at knee                                             out of  fascia      +------------------+---------+------+-----------+------------+-------------  ----+  GSV prox calf               yes    >500 ms      0.39    branch  feeding                                                             varicosities  of                                                            the proximal  calf  +------------------+---------+------+-----------+------------+-------------  ----+  SSV Pop Fossa     no                            0.21                        +------------------+---------+------+-----------+------------+-------------  ----+  anterior accessoryno                                    prior                                                                        ablation/strippin  g                   +------------------+---------+------+-----------+------------+-------------  ----+         Summary:  Right:  - No evidence of deep vein thrombosis from the common femoral through the  popliteal veins.  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  - The deep venous system is competent.  - The great saphenous vein is not competent.  - The small saphenous vein is competent.   I evaluated the great saphenous vein at bedside today which does not appear to be large enough for any intervention at this time        Assessment/Plan:    42 year old female with a history of right anterior accessory saphenous vein ablation and stab phlebectomy between 10 and 20 for C3 venous disease.  She now returns with right groin pain which does not appear to be associated with any venous abnormality and I have recommended to follow-up with her primary care doctor for this.  As far as  the reticular veins below the knee I will have Cherene Julian contact her and I have given her Nancy's card as well to discuss possible sclerotherapy and I have included pictures above.  Patient can otherwise follow-up with me on an as-needed basis.     Maeola Harman MD [facility]

## 2023-02-16 ENCOUNTER — Encounter: Payer: Self-pay | Admitting: Internal Medicine

## 2023-02-16 DIAGNOSIS — I8390 Asymptomatic varicose veins of unspecified lower extremity: Secondary | ICD-10-CM | POA: Insufficient documentation

## 2023-02-16 NOTE — Assessment & Plan Note (Addendum)
Worse per patient.  Right leg.  Follow-up with Vein clinic.

## 2023-02-22 ENCOUNTER — Encounter: Payer: Self-pay | Admitting: Internal Medicine

## 2023-02-25 ENCOUNTER — Other Ambulatory Visit: Payer: Self-pay | Admitting: Internal Medicine

## 2023-02-25 NOTE — Telephone Encounter (Addendum)
Per chart labs was collected 01/31/2023  no results. Sending msg to lab to check on status..Lab printed results place in MD purple folder.Marland KitchenRaechel Chute

## 2023-02-26 NOTE — Telephone Encounter (Signed)
Not sure why results are not in. Lab fax results yesterday place on MD desk.Marland KitchenRaechel [name]

## 2023-03-01 ENCOUNTER — Ambulatory Visit
Admission: RE | Admit: 2023-03-01 | Discharge: 2023-03-01 | Disposition: A | Discharge status: Home / Self Care | Payer: Managed Care, Other (non HMO) | Source: Ambulatory Visit | Attending: Internal Medicine | Admitting: Internal Medicine

## 2023-03-28 ENCOUNTER — Encounter: Payer: Self-pay | Admitting: Internal Medicine

## 2023-03-28 ENCOUNTER — Ambulatory Visit (INDEPENDENT_AMBULATORY_CARE_PROVIDER_SITE_OTHER): Payer: Managed Care, Other (non HMO) | Admitting: Internal Medicine

## 2023-03-28 VITALS — BP 106/70 | HR 86 | Temp 98.5°F | Ht 70.0 in | Wt 187.0 lb

## 2023-03-28 DIAGNOSIS — R1031 Right lower quadrant pain: Secondary | ICD-10-CM | POA: Diagnosis not present

## 2023-03-28 DIAGNOSIS — L03317 Cellulitis of buttock: Secondary | ICD-10-CM | POA: Diagnosis not present

## 2023-03-28 DIAGNOSIS — M25569 Pain in unspecified knee: Secondary | ICD-10-CM | POA: Insufficient documentation

## 2023-03-28 DIAGNOSIS — L0231 Cutaneous abscess of buttock: Secondary | ICD-10-CM | POA: Diagnosis not present

## 2023-03-28 DIAGNOSIS — M25562 Pain in left knee: Secondary | ICD-10-CM

## 2023-03-28 MED ORDER — DOXYCYCLINE HYCLATE 100 MG PO TABS: 100.0000 mg | ORAL_TABLET | Freq: Two times a day (BID) | ORAL | 2 refills | Status: DC

## 2023-03-28 MED ORDER — NAPROXEN 500 MG PO TABS: 500.0000 mg | ORAL_TABLET | Freq: Two times a day (BID) | ORAL | 2 refills | Status: AC | PRN

## 2023-03-28 NOTE — Progress Notes (Signed)
Subjective:  Patient ID: Sonya [name], female    DOB: 1981/06/24  Age: 42 y.o. MRN: [id number]  CC: Follow-up (6 week f/u, rt ankle and foot pain)   HPI Jeff Chessor presents for R Knee stiffness, R groin pain w/R groin abscess, is understanding labia area.  Complaining of the left knee pain and stiffness of several days duration.  Complaining of mobile left knee, grinding sound and knee stiffness.  She has been unable to kneel or to crouch without pain.  Outpatient Medications Prior to Visit  Medication Sig Dispense Refill   fluticasone (FLONASE) 50 MCG/ACT nasal spray Place 2 sprays into both nostrils daily. 16 g 6   levocetirizine (XYZAL) 5 MG tablet Take 1 tablet (5 mg total) by mouth every evening. 30 tablet 0   LORazepam (ATIVAN) 0.5 MG tablet Take 1 tablet (0.5 mg total) by mouth 2 (two) times daily as needed for anxiety or sleep. 60 tablet 1   methylPREDNISolone (MEDROL DOSEPAK) 4 MG TBPK tablet As directed 21 tablet 0   metoCLOPramide (REGLAN) 10 MG tablet TAKE 1 TABLET 3 TIMES A DAY BY ORAL ROUTE.     mupirocin ointment (BACTROBAN) 2 % Apply 1 application topically 2 (two) times daily. 30 g 2   promethazine-dextromethorphan (PROMETHAZINE-DM) 6.25-15 MG/5ML syrup Take 5 mLs by mouth 3 (three) times daily as needed for cough. 200 mL 0   pseudoephedrine (SUDAFED) 60 MG tablet Take 1 tablet (60 mg total) by mouth every 8 (eight) hours as needed for congestion. 30 tablet 0   valACYclovir (VALTREX) 1000 MG tablet Take 1 tablet (1,000 mg total) by mouth 3 (three) times daily. 21 tablet 0   sulfamethoxazole-trimethoprim (BACTRIM DS) 800-160 MG tablet Take 1 tablet by mouth 2 (two) times daily. 14 tablet 0   benzonatate (TESSALON) 100 MG capsule Take 1-2 capsules (100-200 mg total) by mouth 3 (three) times daily as needed for cough. 60 capsule 0   No facility-administered medications prior to visit.    ROS: Review of Systems  Constitutional:  Negative for activity change,  appetite change, chills, fatigue and unexpected weight change.  HENT:  Negative for congestion, mouth sores and sinus pressure.   Eyes:  Negative for visual disturbance.  Respiratory:  Negative for cough and chest tightness.   Gastrointestinal:  Negative for abdominal pain and nausea.  Genitourinary:  Negative for difficulty urinating, frequency and vaginal pain.  Musculoskeletal:  Positive for gait problem. Negative for back pain.  Skin:  Negative for pallor, rash and wound.  Neurological:  Negative for dizziness, tremors, weakness, numbness and headaches.  Psychiatric/Behavioral:  Negative for confusion and sleep disturbance.     Objective:  BP 106/70 (BP Location: Left Arm, Patient Position: Sitting, Cuff Size: Large)   Pulse 86   Temp 98.5 F (36.9 C) (Oral)   Ht 5\' 10"  (1.778 m)   Wt 187 lb (84.8 kg)   SpO2 98%   BMI 26.83 kg/m   BP Readings from Last 3 Encounters:  03/28/23 106/70  02/06/23 116/78  01/31/23 112/70    Wt Readings from Last 3 Encounters:  03/28/23 187 lb (84.8 kg)  02/06/23 184 lb (83.5 kg)  01/31/23 183 lb (83 kg)    Physical Exam Constitutional:      General: She is not in acute distress.    Appearance: She is well-developed.  HENT:     Head: Normocephalic.     Right Ear: External ear normal.     Left Ear: External ear normal.  Nose: Nose normal.  Eyes:     General:        Right eye: No discharge.        Left eye: No discharge.     Conjunctiva/sclera: Conjunctivae normal.     Pupils: Pupils are equal, round, and reactive to light.  Neck:     Thyroid: No thyromegaly.     Vascular: No JVD.     Trachea: No tracheal deviation.  Cardiovascular:     Rate and Rhythm: Normal rate and regular rhythm.     Heart sounds: Normal heart sounds.  Pulmonary:     Effort: No respiratory distress.     Breath sounds: No stridor. No wheezing.  Abdominal:     General: Bowel sounds are normal. There is no distension.     Palpations: Abdomen is soft.  There is no mass.     Tenderness: There is no abdominal tenderness. There is no guarding or rebound.  Musculoskeletal:        General: Tenderness present.     Cervical back: Normal range of motion and neck supple. No rigidity.     Right lower leg: No edema.     Left lower leg: No edema.  Lymphadenopathy:     Cervical: No cervical adenopathy.  Skin:    Findings: No erythema or rash.  Neurological:     Cranial Nerves: No cranial nerve deficit.     Motor: No abnormal muscle tone.     Coordination: Coordination normal.     Deep Tendon Reflexes: Reflexes normal.  Psychiatric:        Behavior: Behavior normal.        Thought Content: Thought content normal.        Judgment: Judgment normal.   Right groin is slightly tender Left knee is sensitive over medial meniscus.  Mobile left knee.  Slight posterior swelling.  Pain with range of motion  Lab Results  Component Value Date   WBC 5.5 01/23/2022   HGB 13.9 01/23/2022   HCT 41.0 01/23/2022   PLT 298.0 01/23/2022   GLUCOSE 91 01/23/2022   CHOL 208 (H) 01/23/2022   TRIG 114.0 01/23/2022   HDL 47.60 01/23/2022   LDLCALC 137 (H) 01/23/2022   ALT 15 01/23/2022   AST 13 01/23/2022   NA 141 01/23/2022   K 4.1 01/23/2022   CL 105 01/23/2022   CREATININE 0.59 01/23/2022   BUN 10 01/23/2022   CO2 29 01/23/2022   TSH 1.06 01/23/2022    US Abdomen Complete  Result Date: 03/01/2023 CLINICAL DATA:  Right upper quadrant abdominal pain EXAM: ABDOMEN ULTRASOUND COMPLETE COMPARISON:  None Available. FINDINGS: Gallbladder: No gallstones or wall thickening visualized. No sonographic Murphy sign noted by sonographer. Common bile duct: Diameter: 4.8 mm Liver: Mildly increased echogenicity. No focal lesion. Portal vein is patent on color Doppler imaging with normal direction of blood flow towards the liver. IVC: No abnormality visualized. Pancreas: Visualized portion unremarkable. Spleen: Size and appearance within normal limits. Right Kidney:  Length: 12.1 cm. Echogenicity within normal limits. No mass or hydronephrosis visualized. Left Kidney: Length: 13.7 cm. Echogenicity within normal limits. No mass or hydronephrosis visualized. Abdominal aorta: No aneurysm visualized. Other findings: None. IMPRESSION: 1. No cholelithiasis or sonographic evidence for acute cholecystitis. 2. Mildly increased hepatic parenchymal echogenicity suggestive of steatosis. Electronically Signed   By: Annia Belt M.D.   On: 03/01/2023 09:49    Assessment & Plan:   Problem List Items Addressed This Visit  Knee pain, acute - Primary    Likely previous left knee meniscus injury.  Patellofemoral syndrome. Exercises provided Blue-Emu cream was recommended to use 2-3 times a day Knee sleeve brace Sports medicine referral offered Naproxen 500 mg twice daily      Abscess and cellulitis of gluteal region    Recurrent Right labia/buttock area per patient Doxycycline p.o. as needed Sitz bath's       Groin pain, right    Likely related to reactive adenopathy due to groin abscess Will monitor resolving symptoms         Meds ordered this encounter  Medications   doxycycline (VIBRA-TABS) 100 MG tablet    Sig: Take 1 tablet (100 mg total) by mouth 2 (two) times daily.    Dispense:  20 tablet    Refill:  2   naproxen (NAPROSYN) 500 MG tablet    Sig: Take 1 tablet (500 mg total) by mouth 2 (two) times daily as needed for moderate pain.    Dispense:  60 tablet    Refill:  2      Follow-up: Return in about 6 weeks (around 05/09/2023).  Sonda Primes, MD

## 2023-03-28 NOTE — Patient Instructions (Signed)
Blue-Emu cream -- use 2-3 times a day ? ?

## 2023-04-01 ENCOUNTER — Encounter: Payer: Self-pay | Admitting: Internal Medicine

## 2023-04-01 DIAGNOSIS — R1031 Right lower quadrant pain: Secondary | ICD-10-CM | POA: Insufficient documentation

## 2023-04-01 DIAGNOSIS — L0231 Cutaneous abscess of buttock: Secondary | ICD-10-CM | POA: Insufficient documentation

## 2023-04-01 DIAGNOSIS — L03317 Cellulitis of buttock: Secondary | ICD-10-CM | POA: Insufficient documentation

## 2023-04-01 NOTE — Assessment & Plan Note (Signed)
Likely related to reactive adenopathy due to groin abscess Will monitor resolving symptoms

## 2023-04-01 NOTE — Assessment & Plan Note (Signed)
Likely previous left knee meniscus injury.  Patellofemoral syndrome. Exercises provided Blue-Emu cream was recommended to use 2-3 times a day Knee sleeve brace Sports medicine referral offered Naproxen 500 mg twice daily

## 2023-04-01 NOTE — Assessment & Plan Note (Addendum)
Recurrent Right labia/buttock area per patient Doxycycline p.o. as needed Sitz bath's

## 2023-05-30 ENCOUNTER — Ambulatory Visit (INDEPENDENT_AMBULATORY_CARE_PROVIDER_SITE_OTHER): Payer: Managed Care, Other (non HMO)

## 2023-05-30 ENCOUNTER — Ambulatory Visit: Payer: Managed Care, Other (non HMO) | Admitting: Podiatry

## 2023-05-30 DIAGNOSIS — M7752 Other enthesopathy of left foot: Secondary | ICD-10-CM

## 2023-05-30 DIAGNOSIS — M25562 Pain in left knee: Secondary | ICD-10-CM

## 2023-05-30 NOTE — Progress Notes (Signed)
Pt presented for left knee pain, not ankle. No charge for visit. Will place referral to orthopedics.

## 2023-07-25 ENCOUNTER — Encounter: Payer: Self-pay | Admitting: Internal Medicine

## 2023-07-31 ENCOUNTER — Other Ambulatory Visit: Payer: Self-pay | Admitting: Internal Medicine

## 2023-07-31 DIAGNOSIS — M25562 Pain in left knee: Secondary | ICD-10-CM

## 2023-08-06 ENCOUNTER — Other Ambulatory Visit: Payer: Self-pay

## 2023-08-06 ENCOUNTER — Ambulatory Visit (INDEPENDENT_AMBULATORY_CARE_PROVIDER_SITE_OTHER): Payer: Managed Care, Other (non HMO) | Admitting: Family Medicine

## 2023-08-06 ENCOUNTER — Ambulatory Visit (INDEPENDENT_AMBULATORY_CARE_PROVIDER_SITE_OTHER): Payer: Managed Care, Other (non HMO)

## 2023-08-06 VITALS — BP 142/88 | HR 82 | Ht 70.0 in | Wt 188.0 lb

## 2023-08-06 DIAGNOSIS — G8929 Other chronic pain: Secondary | ICD-10-CM | POA: Diagnosis not present

## 2023-08-06 DIAGNOSIS — M25562 Pain in left knee: Secondary | ICD-10-CM | POA: Diagnosis not present

## 2023-08-06 MED ORDER — MELOXICAM 15 MG PO TABS: 15.0000 mg | ORAL_TABLET | Freq: Every day | ORAL | 1 refills | Status: AC | PRN

## 2023-08-06 NOTE — Patient Instructions (Addendum)
Thank you for coming in today.   Please get an Xray today before you leave   I've referred you to Physical Therapy.  Let us know if you don't hear from them in one week.   I recommend you obtained a compression sleeve to help with your joint problems. There are many options on the market however I recommend obtaining a full knee Body Helix compression sleeve.  You can find information (including how to appropriate measure yourself for sizing) can be found at www.[employer].ch.  Many of these products are health savings account (HSA) eligible.   You can use the compression sleeve at any time throughout the day but is most important to use while being active as well as for 2 hours post-activity.   It is appropriate to ice following activity with the compression sleeve in place.   Recheck in about 6 weeks.

## 2023-08-06 NOTE — Progress Notes (Signed)
Rubin Payor, PhD, LAT, ATC acting as a scribe for Clementeen Graham, MD.  Sonya [name] is a 42 y.o. female who presents to [facility] today for L knee pain x 6 month. No injury. Pt locates pain to the anterior and posterior aspect of her L knee. When she in [place] last month, she was seen by a provider, US revealed Baker's cyst.  She was prescribed tenoxicam and topical diclofenac gel.  This helped quite a bit and she is feeling a lot better.  L Knee swelling: yes Mechanical symptoms: yes Aggravates: squats, sitting down Treatments tried: Voltaren gel, blue emu oil   Pertinent review of systems: No fevers or chills  Relevant historical information: Otherwise healthy.   Exam:  BP (!) 142/88   Pulse 82   Ht 5\' 10"  (1.778 m)   Wt 188 lb (85.3 kg)   SpO2 99%   BMI 26.98 kg/m  General: Well Developed, well nourished, and in no acute distress.   MSK: Left knee mild effusion otherwise normal. Normal motion without crepitation. Tender palpation medial joint line. Intact strength. Positive medial McMurray's test.    Lab and Radiology Results  X-ray images left knee obtained today personally and independently interpreted Mild medial compartment DJD.  No acute fractures are visible. Await formal radiology review  Diagnostic Limited MSK Ultrasound of: Left knee Quadricep tendon is intact and normal. Minimal joint effusion present superior patellar space.  Nexa patellar tendon normal. Medial joint line mild degeneration. Lateral joint line is normal. Posterior knee no visible Baker's cyst today. Impression: Medial degeneration   Assessment and Plan: 42 y.o. female with left knee pain previously seen with a Baker's cyst based on ultrasound images provided by patient.  This has improved with oral and topical NSAIDs.  Plan to continue limited oral meloxicam and topical diclofenac gel.  Recommend compression sleeve.  Also will refer to physical  therapy.  Check back in about 6 weeks.  If not improving consider steroid injection.   PDMP not reviewed this encounter. Orders Placed This Encounter  Procedures   [place] LIMITED JOINT SPACE STRUCTURES LOW LEFT(NO LINKED CHARGES)    Order Specific Question:   Reason for Exam (SYMPTOM  OR DIAGNOSIS REQUIRED)    Answer:   left knee pain    Order Specific Question:   Preferred imaging location?    Answer:   [facility] Knee AP/LAT W/[place] Left    Standing Status:   Future    Number of Occurrences:   1    Standing Expiration Date:   09/06/2023    Order Specific Question:   Reason for Exam (SYMPTOM  OR DIAGNOSIS REQUIRED)    Answer:   left knee pain    Order Specific Question:   Preferred imaging location?    Answer:   [facility]    Order Specific Question:   Is patient pregnant?    Answer:   No   Ambulatory referral to Physical Therapy    Referral Priority:   Routine    Referral Type:   Physical Medicine    Referral Reason:   Specialty Services Required    Requested Specialty:   Physical Therapy    Number of Visits Requested:   1   Meds ordered this encounter  Medications   meloxicam (MOBIC) 15 MG tablet    Sig: Take 1 tablet (15 mg total) by mouth daily as needed.    Dispense:  30 tablet    Refill:  1     Discussed warning signs or symptoms. Please see discharge instructions. Patient expresses understanding.   The above documentation has been reviewed and is accurate and complete Clementeen Graham, M.D. Today's visit conducted using a [place] interpreter.

## 2023-08-13 NOTE — Progress Notes (Signed)
Left knee MRI looks normal to radiology.

## 2023-08-19 ENCOUNTER — Ambulatory Visit: Payer: Managed Care, Other (non HMO) | Admitting: Internal Medicine

## 2023-08-19 VITALS — Ht 70.0 in

## 2023-08-19 DIAGNOSIS — L853 Xerosis cutis: Secondary | ICD-10-CM

## 2023-08-19 DIAGNOSIS — L989 Disorder of the skin and subcutaneous tissue, unspecified: Secondary | ICD-10-CM

## 2023-08-19 NOTE — Progress Notes (Signed)
Subjective:  Patient ID: Sonya [name], female    DOB: 02/07/1981  Age: 42 y.o. MRN: [id number]  CC: Nevus (Discuss removal of 2 moles located omn pts stomach and neck.)   HPI Sonya [name] presents for moles  Outpatient Medications Prior to Visit  Medication Sig Dispense Refill   fluticasone (FLONASE) 50 MCG/ACT nasal spray Place 2 sprays into both nostrils daily. 16 g 6   levocetirizine (XYZAL) 5 MG tablet Take 1 tablet (5 mg total) by mouth every evening. 30 tablet 0   LORazepam (ATIVAN) 0.5 MG tablet Take 1 tablet (0.5 mg total) by mouth 2 (two) times daily as needed for anxiety or sleep. 60 tablet 1   meloxicam (MOBIC) 15 MG tablet Take 1 tablet (15 mg total) by mouth daily as needed. 30 tablet 1   metoCLOPramide (REGLAN) 10 MG tablet TAKE 1 TABLET 3 TIMES A DAY BY ORAL ROUTE.     mupirocin ointment (BACTROBAN) 2 % Apply 1 application topically 2 (two) times daily. 30 g 2   naproxen (NAPROSYN) 500 MG tablet Take 1 tablet (500 mg total) by mouth 2 (two) times daily as needed for moderate pain. 60 tablet 2   promethazine-dextromethorphan (PROMETHAZINE-DM) 6.25-15 MG/5ML syrup Take 5 mLs by mouth 3 (three) times daily as needed for cough. 200 mL 0   pseudoephedrine (SUDAFED) 60 MG tablet Take 1 tablet (60 mg total) by mouth every 8 (eight) hours as needed for congestion. 30 tablet 0   triazolam (HALCION) 0.125 MG tablet Take 0.125 mg by mouth at bedtime.     valACYclovir (VALTREX) 1000 MG tablet Take 1 tablet (1,000 mg total) by mouth 3 (three) times daily. 21 tablet 0   No facility-administered medications prior to visit.    ROS: Review of Systems  Constitutional:  Negative for fatigue and unexpected weight change.  Skin:  Positive for color change.    Objective:  Ht 5\' 10"  (1.778 m)   LMP 07/29/2023   BMI 26.98 kg/m   BP Readings from Last 3 Encounters:  08/06/23 (!) 142/88  03/28/23 106/70  02/06/23 116/78    Wt Readings from Last 3 Encounters:  08/06/23 188 lb  (85.3 kg)  03/28/23 187 lb (84.8 kg)  02/06/23 184 lb (83.5 kg)    Physical Exam Constitutional:      Appearance: Normal appearance.  Skin:    Findings: Lesion present. No bruising, erythema or rash.  Neurological:     Mental Status: She is alert and oriented to person, place, and time.  Psychiatric:        Thought Content: Thought content normal.    One mole on the right mid abdomen One mole on the posterior neck on the right No rash  Lab Results  Component Value Date   WBC 6.9 01/31/2023   HGB 12.3 01/31/2023   HCT 36.7 01/31/2023   PLT 319.0 01/31/2023   GLUCOSE 86 01/31/2023   CHOL 216 (H) 01/31/2023   TRIG 77.0 01/31/2023   HDL 58.90 01/31/2023   LDLCALC 142 (H) 01/31/2023   ALT 12 01/31/2023   AST 14 01/31/2023   NA 138 01/31/2023   K 4.2 01/31/2023   CL 104 01/31/2023   CREATININE 0.62 01/31/2023   BUN 12 01/31/2023   CO2 27 01/31/2023   TSH 0.79 01/31/2023    US Abdomen Complete  Result Date: 03/01/2023 CLINICAL DATA:  Right upper quadrant abdominal pain EXAM: ABDOMEN ULTRASOUND COMPLETE COMPARISON:  None Available. FINDINGS: Gallbladder: No gallstones or wall thickening visualized.  No sonographic Murphy sign noted by sonographer. Common bile duct: Diameter: 4.8 mm Liver: Mildly increased echogenicity. No focal lesion. Portal vein is patent on color Doppler imaging with normal direction of blood flow towards the liver. IVC: No abnormality visualized. Pancreas: Visualized portion unremarkable. Spleen: Size and appearance within normal limits. Right Kidney: Length: 12.1 cm. Echogenicity within normal limits. No mass or hydronephrosis visualized. Left Kidney: Length: 13.7 cm. Echogenicity within normal limits. No mass or hydronephrosis visualized. Abdominal aorta: No aneurysm visualized. Other findings: None. IMPRESSION: 1. No cholelithiasis or sonographic evidence for acute cholecystitis. 2. Mildly increased hepatic parenchymal echogenicity suggestive of steatosis.  Electronically Signed   By: Annia Belt M.D.   On: 03/01/2023 09:49    Assessment & Plan:   Problem List Items Addressed This Visit     Dry skin dermatitis    Wintertime - fingers Sebamed soap Kenalog prn      Skin lesions - Primary    One mole on the right mid abdomen One mole on the posterior neck on the right Options discussed.  Will schedule skin biopsy         No orders of the defined types were placed in this encounter.     Follow-up: Return in about 1 week (around 08/26/2023).  Sonda Primes, MD

## 2023-08-20 ENCOUNTER — Encounter: Payer: Self-pay | Admitting: Internal Medicine

## 2023-08-20 DIAGNOSIS — L989 Disorder of the skin and subcutaneous tissue, unspecified: Secondary | ICD-10-CM | POA: Insufficient documentation

## 2023-08-20 NOTE — Assessment & Plan Note (Signed)
One mole on the right mid abdomen One mole on the posterior neck on the right Options discussed.  Will schedule skin biopsy

## 2023-09-15 ENCOUNTER — Encounter: Payer: Self-pay | Admitting: Internal Medicine

## 2023-09-15 NOTE — Assessment & Plan Note (Signed)
Wintertime - fingers Sebamed soap Kenalog prn

## 2023-09-27 ENCOUNTER — Ambulatory Visit (INDEPENDENT_AMBULATORY_CARE_PROVIDER_SITE_OTHER): Payer: Managed Care, Other (non HMO) | Admitting: Podiatry

## 2023-09-27 DIAGNOSIS — L600 Ingrowing nail: Secondary | ICD-10-CM

## 2023-09-27 MED ORDER — SULFAMETHOXAZOLE-TRIMETHOPRIM 800-160 MG PO TABS: 1.0000 | ORAL_TABLET | Freq: Two times a day (BID) | ORAL | Status: DC

## 2023-09-27 MED ORDER — SULFAMETHOXAZOLE-TRIMETHOPRIM 800-160 MG PO TABS: 1.0000 | ORAL_TABLET | Freq: Two times a day (BID) | ORAL | 0 refills | Status: DC

## 2023-09-27 NOTE — Patient Instructions (Signed)

## 2023-09-27 NOTE — Progress Notes (Unsigned)
Subjective: Chief Complaint  Patient presents with   Ingrown Toenail    RM#13 Ingrown nails bilateral wants to have conversation.   The left lateral border has also started to hurt and she is asking about getting this corner done as well.   Objective: AAO x3, NAD DP/PT pulses palpable bilaterally, CRT less than 3 seconds  No pain with calf compression, swelling, warmth, erythema  Assessment: Ingrown toenail left and right hallux, lateral border  Plan: -All treatment options discussed with the patient including all alternatives, risks, complications.  -At this time, the patient is requesting partial nail removal with chemical matricectomy to the symptomatic portion of the nail. Risks and complications were discussed with the patient for which they understand and written consent was obtained. Under sterile conditions a total of 3 mL of a mixture of 2% lidocaine plain and 0.5% Marcaine plain was infiltrated in a hallux block fashion. Once anesthetized, the skin was prepped in sterile fashion. A tourniquet was then applied. Next the lateral aspect of hallux nail border was then sharply excised making sure to remove the entire offending nail border. Once the nails were ensured to be removed area was debrided and the underlying skin was intact. There is no purulence identified in the procedure. Next sodium hydroxide was then applied under standard conditions and copiously irrigated. Silvadene was applied. A dry sterile dressing was applied. After application of the dressing the tourniquet was removed and there is found to be an immediate capillary refill time to the digit. The patient tolerated the procedure well any complications. Post procedure instructions were discussed the patient for which he verbally understood. Discussed signs/symptoms of infection and directed to call the office immediately should any occur or go directly to the emergency room. In the meantime, encouraged to call the office  with any questions, concerns, changes symptoms. -Patient encouraged to call the office with any questions, concerns, change in symptoms.

## 2023-10-03 ENCOUNTER — Ambulatory Visit: Payer: Managed Care, Other (non HMO) | Admitting: Family Medicine

## 2023-10-03 ENCOUNTER — Encounter: Payer: Self-pay | Admitting: Family Medicine

## 2023-10-03 ENCOUNTER — Other Ambulatory Visit: Payer: Self-pay

## 2023-10-03 VITALS — BP 120/72 | HR 99 | Ht 70.0 in | Wt 190.0 lb

## 2023-10-03 DIAGNOSIS — M25562 Pain in left knee: Secondary | ICD-10-CM | POA: Diagnosis not present

## 2023-10-03 DIAGNOSIS — G8929 Other chronic pain: Secondary | ICD-10-CM

## 2023-10-03 NOTE — Progress Notes (Signed)
   I, Stevenson Clinch, CMA acting as a scribe for Clementeen Graham, MD.  Sonya [name] is a 42 y.o. female who presents to [facility] at [facility] today for f/u L knee pain. Pt was last seen by Dr. Denyse Amass on 08/06/23 and was advised to continue limited oral meloxicam and topical diclofenac gel. Also advised to use a compression sleeve and was referred to [facility] PT.  Today, pt reports significant improvement of knee sx. Denies swelling, mechanical sx. Pt has completed PT and attributes improvement to time spent with PT and HEP. Uses compression while more active. Has not been using Voltaren Gel because sx have improved so much.   Dx imaging: 08/06/23 L knee XR  Pertinent review of systems: No fevers or chills  Relevant historical information: GERD   Exam:  BP 120/72   Pulse 99   Ht 5\' 10"  (1.778 m)   Wt 190 lb (86.2 kg)   SpO2 (!) 77%   BMI 27.26 kg/m  General: Well Developed, well nourished, and in no acute distress.   MSK: Left knee: Normal knee motion normal gait    Lab and Radiology Results  EXAM: LEFT KNEE 3 VIEWS   COMPARISON:  None Available.   FINDINGS: No evidence of fracture, dislocation, or joint effusion. No evidence of arthropathy or other focal bone abnormality. Soft tissues are unremarkable.   IMPRESSION: Negative.     Electronically Signed   By: Layla Maw M.D.   On: 08/12/2023 20:12   I, Clementeen Graham, personally (independently) visualized and performed the interpretation of the images attached in this note.     Assessment and Plan: 42 y.o. female with chronic knee pain improvement with physical therapy.  Plan to continue home exercise program.  Resume Voltaren gel or meloxicam as needed.  Check back as needed.   PDMP not reviewed this encounter. No orders of the defined types were placed in this encounter.  No orders of the defined types were placed in this encounter.    Discussed warning signs or symptoms. Please  see discharge instructions. Patient expresses understanding.   The above documentation has been reviewed and is accurate and complete Clementeen Graham, M.D. Today's visit completed using a [place] interpreter.

## 2023-10-03 NOTE — Patient Instructions (Signed)
Thank you for coming in today.    

## 2023-10-11 ENCOUNTER — Encounter: Payer: Self-pay | Admitting: Podiatry

## 2023-10-11 ENCOUNTER — Ambulatory Visit (INDEPENDENT_AMBULATORY_CARE_PROVIDER_SITE_OTHER): Payer: Managed Care, Other (non HMO) | Admitting: Podiatry

## 2023-10-11 DIAGNOSIS — L600 Ingrowing nail: Secondary | ICD-10-CM

## 2023-10-11 NOTE — Progress Notes (Signed)
       Subjective:  Patient ID: Sonya [name], female    DOB: 14-Jan-1981,  MRN: [id number]  Chief Complaint  Patient presents with   Nail Problem    She has noticed some dry skin around the edge of the big toes on both feet.     Zerenity Dury presents to clinic today for f/u of PNA to the bilateral great toe lateral borders. Doing great. Denies pain. Reports some dry skin to the borders with occasional oozing and bleeding from the sites at this point following foot soaks.  PCP is Plotnikov, Georgina Quint, MD.  Allergies  Allergen Reactions   Augmentin [Amoxicillin-Pot Clavulanate]     nausea   Kefzol [Cefazolin] Other (See Comments)    Fever?    Objective:  There were no vitals filed for this visit.  Vascular Examination: Capillary refill time is 3 seconds to toes bilateral. Palpable pedal pulses b/l LE. Digital hair present b/l. No pedal edema b/l. Skin temperature gradient WNL b/l. No varicosities b/l. No cyanosis or clubbing noted b/l.   Dermatological Examination: Upon inspection of the PNA site b/l 1st toes, there are no clinical signs of infection.  No purulence, no necrosis, no malodor present.  Minimal to no erythema present.  Minimal oozing at the sites, granulation tissue filing in.  Minimal to no pain on palpation of area.   Assessment/Plan: 1. Ingrown toenail     No orders of the defined types were placed in this encounter.  Plan: - The patient is doing great at this point following PNA of the lateral borders of the first toenails. -Still experiencing some very mild bruising at this point.  No significant pain.  Advised patient that she will likely begin to form a scab/eschar over the next week or 2. - Continue foot soaks and bandaging on the sites until this begins to occur. - Resume regular shoes and regular activity at this point.  Return if symptoms worsen or fail to improve, for Nail Check.   Bronwen Betters, DPM, AACFAS [facility]     [address] [place], [place] [address]                Office [phone]  Fax [phone]

## 2023-11-12 ENCOUNTER — Ambulatory Visit
Admission: EM | Admit: 2023-11-12 | Discharge: 2023-11-12 | Disposition: A | Discharge status: Home / Self Care | Payer: Managed Care, Other (non HMO) | Attending: Nurse Practitioner | Admitting: Nurse Practitioner

## 2023-11-12 DIAGNOSIS — R490 Dysphonia: Secondary | ICD-10-CM

## 2023-11-12 DIAGNOSIS — J22 Unspecified acute lower respiratory infection: Secondary | ICD-10-CM

## 2023-11-12 MED ORDER — AZITHROMYCIN 250 MG PO TABS: 250.0000 mg | ORAL_TABLET | Freq: Every day | ORAL | 0 refills | Status: AC

## 2023-11-12 MED ORDER — PROMETHAZINE-DM 6.25-15 MG/5ML PO SYRP: 5.0000 mL | ORAL_SOLUTION | Freq: Four times a day (QID) | ORAL | 0 refills | Status: AC | PRN

## 2023-11-12 NOTE — ED Triage Notes (Signed)
Pt reports fever, lost her voice, dry cough, and fever x 1 week.

## 2023-11-12 NOTE — Discharge Instructions (Addendum)
Take medication as directed. Increase fluids and get plenty of rest. May take over-the-counter ibuprofen or Tylenol as needed for pain, fever, or general discomfort. Recommend normal saline nasal spray to help with nasal congestion throughout the day. Also recommend drinking warm tea with honey and lemon to help with throat pain and hoarseness. Warm saltwater gargles 3 to 4 times daily while symptoms persist. For your cough, it may be helpful to use a humidifier at bedtime during sleep. If symptoms fail to improve with this treatment, you may follow-up in this clinic or with your primary care physician for further evaluation.  Follow-up as needed.

## 2023-11-12 NOTE — ED Provider Notes (Signed)
[facility]    CSN: [id number] Arrival date & time: 11/12/23  1807      History   Chief Complaint No chief complaint on file.   HPI Sonya [name] is a 43 y.o. female.   The history is provided by the patient. A language interpreter was used Elenora Fender, [phone]).   Patient presents for complaints of fever, dry cough, and hoarseness.  Patient states symptoms started more than 1 week ago.  States that she ran a fever up until the past 3 days.  Tmax 101.6.  She states that she now has hoarseness and a dry cough.  States cough is worse at night.  Denies headache, ear pain, wheezing, difficulty breathing, chest pain, abdominal pain, nausea, vomiting, diarrhea, or rash.  Reports that she has been taking ibuprofen and over-the-counter cough and cold medications with minimal relief.  Past Medical History:  Diagnosis Date   Varicose veins of leg with pain, right     Patient Active Problem List   Diagnosis Date Noted   Skin lesions 08/20/2023   Abscess and cellulitis of gluteal region 04/01/2023   Groin pain, right 04/01/2023   Knee pain, acute 03/28/2023   Varicose vein of leg 02/16/2023   Epigastric pain 01/31/2023   Cough 01/23/2022   Fatigue 01/23/2022   Chicken pox 07/11/2021   Scapular dyskinesis 03/10/2021   Neck pain 01/19/2021   Precipitous delivery, delivered (current hospitalization) 10/21/2020   Pregnancy 04/27/2020   GERD (gastroesophageal reflux disease) 11/26/2019   Anemia 11/20/2018   Ingrowing toenail 11/20/2018   Normal labor 08/24/2018   Pre-conception counseling 11/14/2017   Onychomycosis 11/14/2017   Well adult exam 11/08/2016   Dry skin dermatitis 11/08/2016    History reviewed. No pertinent surgical history.  OB History     Gravida  3   Para  2   Term  2   Preterm      AB  1   Living  2      SAB  1   IAB      Ectopic      Multiple  0   Live Births  2            Home Medications    Prior to Admission  medications   Medication Sig Start Date End Date Taking? Authorizing Provider  fluticasone (FLONASE) 50 MCG/ACT nasal spray Place 2 sprays into both nostrils daily. 01/23/22   Plotnikov, Georgina Quint, MD  levocetirizine (XYZAL) 5 MG tablet Take 1 tablet (5 mg total) by mouth every evening. 01/13/22   Wallis Bamberg, PA-C  LORazepam (ATIVAN) 0.5 MG tablet Take 1 tablet (0.5 mg total) by mouth 2 (two) times daily as needed for anxiety or sleep. 01/31/23   Plotnikov, Georgina Quint, MD  meloxicam (MOBIC) 15 MG tablet Take 1 tablet (15 mg total) by mouth daily as needed. 08/06/23   Rodolph Bong, MD  metoCLOPramide (REGLAN) 10 MG tablet TAKE 1 TABLET 3 TIMES A DAY BY ORAL ROUTE.    [provider]  mupirocin ointment (BACTROBAN) 2 % Apply 1 application topically 2 (two) times daily. 09/05/21   Vivi Barrack, DPM  naproxen (NAPROSYN) 500 MG tablet Take 1 tablet (500 mg total) by mouth 2 (two) times daily as needed for moderate pain. 03/28/23   Plotnikov, Georgina Quint, MD  pseudoephedrine (SUDAFED) 60 MG tablet Take 1 tablet (60 mg total) by mouth every 8 (eight) hours as needed for congestion. 01/13/22   Wallis Bamberg, PA-C  triazolam (  HALCION) 0.125 MG tablet Take 0.125 mg by mouth at bedtime. 08/12/23   [provider]  valACYclovir (VALTREX) 1000 MG tablet Take 1 tablet (1,000 mg total) by mouth 3 (three) times daily. 07/11/21   Plotnikov, Georgina Quint, MD    Family History Family History  Problem Relation Age of Onset   Hypertension Father    Glaucoma Father     Social History Social History   Tobacco Use   Smoking status: Never   Smokeless tobacco: Never  Vaping Use   Vaping status: Never Used  Substance Use Topics   Alcohol use: No   Drug use: No     Allergies   Augmentin [amoxicillin-pot clavulanate] and Kefzol [cefazolin]   Review of Systems Review of Systems Per HPI  Physical Exam Triage Vital Signs ED Triage Vitals  Encounter Vitals Group     BP 11/12/23 1827 131/84      Systolic BP Percentile --      Diastolic BP Percentile --      Pulse Rate 11/12/23 1827 86     Resp 11/12/23 1827 18     Temp 11/12/23 1827 98.1 F (36.7 C)     Temp Source 11/12/23 1827 Oral     SpO2 11/12/23 1827 98 %     Weight --      Height --      Head Circumference --      Peak Flow --      Pain Score 11/12/23 1829 4     Pain Loc --      Pain Education --      Exclude from Growth Chart --    No data found.  Updated Vital Signs BP 131/84 (BP Location: Right Arm)   Pulse 86   Temp 98.1 F (36.7 C) (Oral)   Resp 18   LMP 10/31/2023   SpO2 98%   Visual Acuity Right Eye Distance:   Left Eye Distance:   Bilateral Distance:    Right Eye Near:   Left Eye Near:    Bilateral Near:     Physical Exam Vitals and nursing note reviewed.  Constitutional:      General: She is not in acute distress.    Appearance: Normal appearance.  HENT:     Head: Normocephalic.     Right Ear: Tympanic membrane, ear canal and external ear normal.     Left Ear: Tympanic membrane, ear canal and external ear normal.     Nose: Nose normal.     Right Turbinates: Enlarged and swollen.     Left Turbinates: Enlarged and swollen.     Right Sinus: No maxillary sinus tenderness or frontal sinus tenderness.     Left Sinus: No maxillary sinus tenderness or frontal sinus tenderness.     Mouth/Throat:     Mouth: Mucous membranes are moist.     Pharynx: Uvula midline. Posterior oropharyngeal erythema present. No pharyngeal swelling, oropharyngeal exudate or postnasal drip.  Eyes:     Extraocular Movements: Extraocular movements intact.     Conjunctiva/sclera: Conjunctivae normal.     Pupils: Pupils are equal, round, and reactive to light.  Cardiovascular:     Rate and Rhythm: Normal rate and regular rhythm.     Pulses: Normal pulses.     Heart sounds: Normal heart sounds.  Pulmonary:     Effort: Pulmonary effort is normal. No respiratory distress.     Breath sounds: Normal breath sounds. No  stridor. No wheezing, rhonchi or rales.  Abdominal:     General: Bowel sounds are normal.     Palpations: Abdomen is soft.     Tenderness: There is no abdominal tenderness.  Musculoskeletal:     Cervical back: Normal range of motion.  Lymphadenopathy:     Cervical: No cervical adenopathy.  Skin:    General: Skin is warm and dry.  Neurological:     General: No focal deficit present.     Mental Status: She is alert and oriented to person, place, and time.  Psychiatric:        Mood and Affect: Mood normal.        Behavior: Behavior normal.      UC Treatments / Results  Labs (all labs ordered are listed, but only abnormal results are displayed) Labs Reviewed - No data to display  EKG   Radiology No results found.  Procedures Procedures (including critical care time)  Medications Ordered in UC Medications - No data to display  Initial Impression / Assessment and Plan / UC Course  I have reviewed the triage vital signs and the nursing notes.  Pertinent labs & imaging results that were available during my care of the patient were reviewed by me and considered in my medical decision making (see chart for details).  On exam, lung sounds are clear throughout, room air sats at 98%.  Patient with persistent cough with past fevers that have since resolved.  Symptoms have been present for more than 1 week.  Given the duration of the patient's symptoms, and continued cough, will treat empirically for lower respiratory infection with azithromycin 250 mg.  For her cough, Promethazine DM was prescribed.  Supportive care recommendations were provided and discussed with the patient to include fluids, rest, over-the-counter analgesics, use of a humidifier during sleep, and warm tea with honey and lemon along with voice rest for hoarseness.  Discussed indications with the patient regarding follow-up.  Patient was in agreement with this plan of care and verbalizes understanding.  All questions  were answered.  Patient stable for discharge.  Final Clinical Impressions(s) / UC Diagnoses   Final diagnoses:  None   Discharge Instructions   None    ED Prescriptions   None    PDMP not reviewed this encounter.   Abran Cantor, NP 11/12/23 [phone]

## 2023-11-21 ENCOUNTER — Ambulatory Visit
Admission: RE | Admit: 2023-11-21 | Discharge: 2023-11-21 | Disposition: A | Discharge status: Home / Self Care | Payer: Managed Care, Other (non HMO) | Source: Ambulatory Visit | Attending: Nurse Practitioner | Admitting: Nurse Practitioner

## 2023-11-21 VITALS — BP 136/76 | HR 86 | Temp 98.3°F | Resp 18

## 2023-11-21 DIAGNOSIS — J22 Unspecified acute lower respiratory infection: Secondary | ICD-10-CM

## 2023-11-21 MED ORDER — FLUTICASONE PROPIONATE 50 MCG/ACT NA SUSP: 2.0000 | Freq: Every day | NASAL | 0 refills | Status: AC

## 2023-11-21 MED ORDER — ALBUTEROL SULFATE HFA 108 (90 BASE) MCG/ACT IN AERS: 2.0000 | INHALATION_SPRAY | Freq: Four times a day (QID) | RESPIRATORY_TRACT | 0 refills | Status: AC | PRN

## 2023-11-21 MED ORDER — PREDNISONE 50 MG PO TABS: ORAL_TABLET | ORAL | 0 refills | Status: AC

## 2023-11-21 NOTE — ED Triage Notes (Signed)
 Pt reports she was seen on 11/12/2023 for cough congestion and headache was treated with antibiotics and sx's still remain. Pt denies fever

## 2023-11-21 NOTE — ED Provider Notes (Signed)
 [facility]    CSN: [id number] Arrival date & time: 11/21/23  1332      History   Chief Complaint No chief complaint on file.   HPI Sonya [name] is a 43 y.o. female.   The history is provided by the patient. A language interpreter was used [place]; [phone]).   Patient presents for follow-up for continued cough, nasal congestion, and headache.  Patient was seen in this clinic on 11/12/2023.  She was prescribed azithromycin  and promethazine  for symptoms.  States that she continues to have symptoms.  Patient denies fever, chills, wheezing, difficulty breathing, chest pain, abdominal pain, nausea, vomiting, diarrhea, or rash.  Patient states that the cough is since become more productive.  She has been taking over-the-counter Mucinex for her symptoms.  Past Medical History:  Diagnosis Date   Varicose veins of leg with pain, right     Patient Active Problem List   Diagnosis Date Noted   Skin lesions 08/20/2023   Abscess and cellulitis of gluteal region 04/01/2023   Groin pain, right 04/01/2023   Knee pain, acute 03/28/2023   Varicose vein of leg 02/16/2023   Epigastric pain 01/31/2023   Cough 01/23/2022   Fatigue 01/23/2022   Chicken pox 07/11/2021   Scapular dyskinesis 03/10/2021   Neck pain 01/19/2021   Precipitous delivery, delivered (current hospitalization) 10/21/2020   Pregnancy 04/27/2020   GERD (gastroesophageal reflux disease) 11/26/2019   Anemia 11/20/2018   Ingrowing toenail 11/20/2018   Normal labor 08/24/2018   Pre-conception counseling 11/14/2017   Onychomycosis 11/14/2017   Well adult exam 11/08/2016   Dry skin dermatitis 11/08/2016    History reviewed. No pertinent surgical history.  OB History     Gravida  3   Para  2   Term  2   Preterm      AB  1   Living  2      SAB  1   IAB      Ectopic      Multiple  0   Live Births  2            Home Medications    Prior to Admission medications   Medication Sig  Start Date End Date Taking? Authorizing Provider  albuterol  (VENTOLIN  HFA) 108 (90 Base) MCG/ACT inhaler Inhale 2 puffs into the lungs every 6 (six) hours as needed. 11/21/23  Yes Leath-Warren, Etta PARAS, NP  fluticasone  (FLONASE ) 50 MCG/ACT nasal spray Place 2 sprays into both nostrils daily. 11/21/23  Yes Leath-Warren, Etta PARAS, NP  predniSONE  (DELTASONE ) 50 MG tablet Take 1 tablet by mouth daily with breakfast for 5 days. 11/21/23  Yes Leath-Warren, Etta PARAS, NP  azithromycin  (ZITHROMAX ) 250 MG tablet Take 1 tablet (250 mg total) by mouth daily. Take first 2 tablets together, then 1 every day until finished. 11/12/23   Leath-Warren, Etta PARAS, NP  levocetirizine (XYZAL ) 5 MG tablet Take 1 tablet (5 mg total) by mouth every evening. 01/13/22   Christopher Savannah, PA-C  LORazepam  (ATIVAN ) 0.5 MG tablet Take 1 tablet (0.5 mg total) by mouth 2 (two) times daily as needed for anxiety or sleep. 01/31/23   Plotnikov, Karlynn GAILS, MD  meloxicam  (MOBIC ) 15 MG tablet Take 1 tablet (15 mg total) by mouth daily as needed. 08/06/23   Corey, Evan S, MD  metoCLOPramide (REGLAN) 10 MG tablet TAKE 1 TABLET 3 TIMES A DAY BY ORAL ROUTE.    [provider]  mupirocin  ointment (BACTROBAN ) 2 % Apply 1 application topically  2 (two) times daily. 09/05/21   Gershon Donnice SAUNDERS, DPM  naproxen  (NAPROSYN ) 500 MG tablet Take 1 tablet (500 mg total) by mouth 2 (two) times daily as needed for moderate pain. 03/28/23   Plotnikov, Aleksei V, MD  promethazine -dextromethorphan (PROMETHAZINE -DM) 6.25-15 MG/5ML syrup Take 5 mLs by mouth 4 (four) times daily as needed. 11/12/23   Leath-Warren, Etta PARAS, NP  pseudoephedrine  (SUDAFED) 60 MG tablet Take 1 tablet (60 mg total) by mouth every 8 (eight) hours as needed for congestion. 01/13/22   Christopher Savannah, PA-C  triazolam (HALCION) 0.125 MG tablet Take 0.125 mg by mouth at bedtime. 08/12/23   [provider]  valACYclovir  (VALTREX ) 1000 MG tablet Take 1 tablet (1,000 mg total) by mouth 3  (three) times daily. 07/11/21   Plotnikov, Karlynn GAILS, MD    Family History Family History  Problem Relation Age of Onset   Hypertension Father    Glaucoma Father     Social History Social History   Tobacco Use   Smoking status: Never   Smokeless tobacco: Never  Vaping Use   Vaping status: Never Used  Substance Use Topics   Alcohol use: No   Drug use: No     Allergies   Kefzol [cefazolin]   Review of Systems Review of Systems Per HPI  Physical Exam Triage Vital Signs ED Triage Vitals [11/21/23 1342]  Encounter Vitals Group     BP      Systolic BP Percentile      Diastolic BP Percentile      Pulse      Resp      Temp      Temp src      SpO2      Weight      Height      Head Circumference      Peak Flow      Pain Score 0     Pain Loc      Pain Education      Exclude from Growth Chart    No data found.  Updated Vital Signs BP 136/76 (BP Location: Right Arm)   Pulse 86   Temp 98.3 F (36.8 C) (Oral)   Resp 18   LMP 10/31/2023   SpO2 96%   Visual Acuity Right Eye Distance:   Left Eye Distance:   Bilateral Distance:    Right Eye Near:   Left Eye Near:    Bilateral Near:     Physical Exam Vitals and nursing note reviewed.  Constitutional:      General: She is not in acute distress.    Appearance: Normal appearance.  HENT:     Head: Normocephalic.     Right Ear: Tympanic membrane, ear canal and external ear normal.     Left Ear: Tympanic membrane, ear canal and external ear normal.     Nose: Congestion present.     Mouth/Throat:     Mouth: Mucous membranes are moist.  Eyes:     Extraocular Movements: Extraocular movements intact.     Conjunctiva/sclera: Conjunctivae normal.     Pupils: Pupils are equal, round, and reactive to light.  Cardiovascular:     Rate and Rhythm: Normal rate and regular rhythm.     Pulses: Normal pulses.     Heart sounds: Normal heart sounds.  Pulmonary:     Effort: Pulmonary effort is normal. No respiratory  distress.     Breath sounds: Normal breath sounds. No stridor. No wheezing, rhonchi or rales.  Abdominal:     General: Bowel sounds are normal.     Palpations: Abdomen is soft.     Tenderness: There is no abdominal tenderness.  Musculoskeletal:     Cervical back: Normal range of motion.  Lymphadenopathy:     Cervical: No cervical adenopathy.  Skin:    General: Skin is warm and dry.  Neurological:     General: No focal deficit present.     Mental Status: She is alert and oriented to person, place, and time.  Psychiatric:        Mood and Affect: Mood normal.        Behavior: Behavior normal.      UC Treatments / Results  Labs (all labs ordered are listed, but only abnormal results are displayed) Labs Reviewed - No data to display  EKG   Radiology No results found.  Procedures Procedures (including critical care time)  Medications Ordered in UC Medications - No data to display  Initial Impression / Assessment and Plan / UC Course  I have reviewed the triage vital signs and the nursing notes.  Pertinent labs & imaging results that were available during my care of the patient were reviewed by me and considered in my medical decision making (see chart for details).  On exam, lung sounds are clear throughout, room air sats at 96%.  Suspect that patient may be developing acute bronchitis with persistent cough.  She has not developed new fever, wheezing, or shortness of breath, will defer use of additional antibiotics at this time.  Prednisone  50 mg prescribed for the next 5 days, and albuterol  inhaler for cough and wheezing, along with fluticasone  50 mcg nasal spray for nasal congestion.  Patient advised to continue over-the-counter Mucinex for cough.  Supportive care recommendations were provided and discussed with patient to include fluids, rest, use of a humidifier during sleep.  Discussed indications regarding follow-up.  Patient was in agreement with this plan of care and  verbalized understanding.  All questions were answered.  Patient stable for discharge.  Final Clinical Impressions(s) / UC Diagnoses   Final diagnoses:  Lower respiratory infection     Discharge Instructions      Continue Mucinex at this time. Take medication as prescribed. Continue over-the-counter ibuprofen  or Tylenol  as needed for pain, fever, or general discomfort. Recommend using a humidifier at nighttime during sleep and sleeping elevated on pillows while symptoms persist. As discussed, if you continue to have a nagging persistent cough, but generally feel well, you may continue over-the-counter cough and cold medications, increase fluids, and use cough drops.  If you develop new symptoms to include fever, chills, wheezing, shortness of breath, difficulty breathing, please follow-up immediately.  Your cough could last anywhere from 4 to 6 weeks. Follow-up as needed.      ED Prescriptions     Medication Sig Dispense Auth. Provider   predniSONE  (DELTASONE ) 50 MG tablet Take 1 tablet by mouth daily with breakfast for 5 days. 5 tablet Leath-Warren, Etta PARAS, NP   fluticasone  (FLONASE ) 50 MCG/ACT nasal spray Place 2 sprays into both nostrils daily. 16 g Leath-Warren, Etta PARAS, NP   albuterol  (VENTOLIN  HFA) 108 (90 Base) MCG/ACT inhaler Inhale 2 puffs into the lungs every 6 (six) hours as needed. 8 g Leath-Warren, Etta PARAS, NP      PDMP not reviewed this encounter.   Gilmer Etta PARAS, NP 11/21/23 1418

## 2023-11-21 NOTE — Discharge Instructions (Addendum)
 Continue Mucinex at this time. Take medication as prescribed. Continue over-the-counter ibuprofen  or Tylenol  as needed for pain, fever, or general discomfort. Recommend using a humidifier at nighttime during sleep and sleeping elevated on pillows while symptoms persist. As discussed, if you continue to have a nagging persistent cough, but generally feel well, you may continue over-the-counter cough and cold medications, increase fluids, and use cough drops.  If you develop new symptoms to include fever, chills, wheezing, shortness of breath, difficulty breathing, please follow-up immediately.  Your cough could last anywhere from 4 to 6 weeks. Follow-up as needed.

## 2024-02-06 ENCOUNTER — Ambulatory Visit (INDEPENDENT_AMBULATORY_CARE_PROVIDER_SITE_OTHER): Payer: Managed Care, Other (non HMO) | Admitting: Internal Medicine

## 2024-02-06 ENCOUNTER — Encounter: Payer: Self-pay | Admitting: Internal Medicine

## 2024-02-06 VITALS — BP 96/64 | HR 71 | Temp 98.5°F | Ht 70.0 in | Wt 186.0 lb

## 2024-02-06 DIAGNOSIS — Z0000 Encounter for general adult medical examination without abnormal findings: Secondary | ICD-10-CM

## 2024-02-06 LAB — URINALYSIS
Bilirubin Urine: NEGATIVE
Ketones, ur: NEGATIVE
Leukocytes,Ua: NEGATIVE
Nitrite: NEGATIVE
Specific Gravity, Urine: 1.01 (ref 1.000–1.030)
Total Protein, Urine: NEGATIVE
Urine Glucose: NEGATIVE
Urobilinogen, UA: 0.2 (ref 0.0–1.0)
pH: 7 (ref 5.0–8.0)

## 2024-02-06 LAB — CBC WITH DIFFERENTIAL/PLATELET
Basophils Absolute: 0 10*3/uL (ref 0.0–0.1)
Basophils Relative: 0.5 % (ref 0.0–3.0)
Eosinophils Absolute: 0.1 10*3/uL (ref 0.0–0.7)
Eosinophils Relative: 0.9 % (ref 0.0–5.0)
HCT: 38.3 % (ref 36.0–46.0)
Hemoglobin: 12.7 g/dL (ref 12.0–15.0)
Lymphocytes Relative: 32.6 % (ref 12.0–46.0)
Lymphs Abs: 2.2 10*3/uL (ref 0.7–4.0)
MCHC: 33.1 g/dL (ref 30.0–36.0)
MCV: 77.5 fl — ABNORMAL LOW (ref 78.0–100.0)
Monocytes Absolute: 0.4 10*3/uL (ref 0.1–1.0)
Monocytes Relative: 6.7 % (ref 3.0–12.0)
Neutro Abs: 3.9 10*3/uL (ref 1.4–7.7)
Neutrophils Relative %: 59.3 % (ref 43.0–77.0)
Platelets: 273 10*3/uL (ref 150.0–400.0)
RBC: 4.94 Mil/uL (ref 3.87–5.11)
RDW: 15.7 % — ABNORMAL HIGH (ref 11.5–15.5)
WBC: 6.7 10*3/uL (ref 4.0–10.5)

## 2024-02-06 LAB — COMPREHENSIVE METABOLIC PANEL WITH GFR
ALT: 13 U/L (ref 0–35)
AST: 14 U/L (ref 0–37)
Albumin: 4.5 g/dL (ref 3.5–5.2)
Alkaline Phosphatase: 36 U/L — ABNORMAL LOW (ref 39–117)
BUN: 10 mg/dL (ref 6–23)
CO2: 29 meq/L (ref 19–32)
Calcium: 9.3 mg/dL (ref 8.4–10.5)
Chloride: 103 meq/L (ref 96–112)
Creatinine, Ser: 0.56 mg/dL (ref 0.40–1.20)
GFR: 113.5 mL/min (ref >60.00)
Glucose, Bld: 95 mg/dL (ref 70–99)
Potassium: 4.1 meq/L (ref 3.5–5.1)
Sodium: 138 meq/L (ref 135–145)
Total Bilirubin: 0.5 mg/dL (ref 0.2–1.2)
Total Protein: 7.7 g/dL (ref 6.0–8.3)

## 2024-02-06 LAB — LIPID PANEL
Cholesterol: 210 mg/dL — ABNORMAL HIGH (ref 0–200)
HDL: 57.7 mg/dL (ref >39.00)
LDL Cholesterol: 135 mg/dL — ABNORMAL HIGH (ref 0–99)
NonHDL: 151.93
Total CHOL/HDL Ratio: 4
Triglycerides: 84 mg/dL (ref 0.0–149.0)
VLDL: 16.8 mg/dL (ref 0.0–40.0)

## 2024-02-06 LAB — TSH: TSH: 0.86 u[IU]/mL (ref 0.35–5.50)

## 2024-02-06 NOTE — Assessment & Plan Note (Addendum)
  We discussed age appropriate health related issues, including available/recomended screening tests and vaccinations. Labs were ordered to be later reviewed . All questions were answered. We discussed one or more of the following - seat belt use, use of sunscreen/sun exposure exercise, safe sex, fall risk reduction, second hand smoke exposure, firearm use and storage, seat belt use, a need for adhering to healthy diet and exercise. Labs were ordered.  All questions were answered.  Labs ordered XRT exposure from [place] (?degree) Vit D, folic acid

## 2024-02-06 NOTE — Progress Notes (Signed)
 Subjective:  Patient ID: Sonya [name], female    DOB: 1981-08-18  Age: 43 y.o. MRN: [id number]  CC: Annual Exam (Annual Exam)   HPI Sonya [name] presents for a well exam  Outpatient Medications Prior to Visit  Medication Sig Dispense Refill   albuterol  (VENTOLIN  HFA) 108 (90 Base) MCG/ACT inhaler Inhale 2 puffs into the lungs every 6 (six) hours as needed. (Patient not taking: Reported on 02/06/2024) 8 g 0   azithromycin  (ZITHROMAX ) 250 MG tablet Take 1 tablet (250 mg total) by mouth daily. Take first 2 tablets together, then 1 every day until finished. (Patient not taking: Reported on 02/06/2024) 6 tablet 0   fluticasone  (FLONASE ) 50 MCG/ACT nasal spray Place 2 sprays into both nostrils daily. (Patient not taking: Reported on 02/06/2024) 16 g 0   levocetirizine (XYZAL ) 5 MG tablet Take 1 tablet (5 mg total) by mouth every evening. (Patient not taking: Reported on 02/06/2024) 30 tablet 0   meloxicam  (MOBIC ) 15 MG tablet Take 1 tablet (15 mg total) by mouth daily as needed. (Patient not taking: Reported on 02/06/2024) 30 tablet 1   metoCLOPramide (REGLAN) 10 MG tablet TAKE 1 TABLET 3 TIMES A DAY BY ORAL ROUTE. (Patient not taking: Reported on 02/06/2024)     mupirocin  ointment (BACTROBAN ) 2 % Apply 1 application topically 2 (two) times daily. (Patient not taking: Reported on 02/06/2024) 30 g 2   naproxen  (NAPROSYN ) 500 MG tablet Take 1 tablet (500 mg total) by mouth 2 (two) times daily as needed for moderate pain. (Patient not taking: Reported on 02/06/2024) 60 tablet 2   predniSONE  (DELTASONE ) 50 MG tablet Take 1 tablet by mouth daily with breakfast for 5 days. (Patient not taking: Reported on 02/06/2024) 5 tablet 0   promethazine -dextromethorphan (PROMETHAZINE -DM) 6.25-15 MG/5ML syrup Take 5 mLs by mouth 4 (four) times daily as needed. (Patient not taking: Reported on 02/06/2024) 118 mL 0   pseudoephedrine  (SUDAFED) 60 MG tablet Take 1 tablet (60 mg total) by mouth every 8 (eight) hours as needed  for congestion. (Patient not taking: Reported on 02/06/2024) 30 tablet 0   triazolam (HALCION) 0.125 MG tablet Take 0.125 mg by mouth at bedtime. (Patient not taking: Reported on 02/06/2024)     valACYclovir  (VALTREX ) 1000 MG tablet Take 1 tablet (1,000 mg total) by mouth 3 (three) times daily. (Patient not taking: Reported on 02/06/2024) 21 tablet 0   LORazepam  (ATIVAN ) 0.5 MG tablet Take 1 tablet (0.5 mg total) by mouth 2 (two) times daily as needed for anxiety or sleep. (Patient not taking: Reported on 02/06/2024) 60 tablet 1   No facility-administered medications prior to visit.    ROS: Review of Systems  Constitutional:  Negative for activity change, appetite change, chills, fatigue and unexpected weight change.  HENT:  Negative for congestion, mouth sores and sinus pressure.   Eyes:  Negative for visual disturbance.  Respiratory:  Negative for cough and chest tightness.   Gastrointestinal:  Negative for abdominal pain and nausea.  Genitourinary:  Negative for difficulty urinating, frequency and vaginal pain.  Musculoskeletal:  Negative for back pain and gait problem.  Skin:  Negative for pallor and rash.  Neurological:  Negative for dizziness, tremors, weakness, numbness and headaches.  Psychiatric/Behavioral:  Negative for confusion, sleep disturbance and suicidal ideas.     Objective:  BP 96/64   Pulse 71   Temp 98.5 F (36.9 C)   Ht 5\' 10"  (1.778 m)   Wt 186 lb (84.4 kg)   SpO2 98%  BMI 26.69 kg/m   BP Readings from Last 3 Encounters:  02/06/24 96/64  11/21/23 136/76  11/12/23 131/84    Wt Readings from Last 3 Encounters:  02/06/24 186 lb (84.4 kg)  10/03/23 190 lb (86.2 kg)  08/06/23 188 lb (85.3 kg)    Physical Exam Constitutional:      General: She is not in acute distress.    Appearance: She is well-developed.  HENT:     Head: Normocephalic.     Right Ear: External ear normal.     Left Ear: External ear normal.     Nose: Nose normal.  Eyes:      General:        Right eye: No discharge.        Left eye: No discharge.     Conjunctiva/sclera: Conjunctivae normal.     Pupils: Pupils are equal, round, and reactive to light.  Neck:     Thyroid : No thyromegaly.     Vascular: No JVD.     Trachea: No tracheal deviation.  Cardiovascular:     Rate and Rhythm: Normal rate and regular rhythm.     Heart sounds: Normal heart sounds.  Pulmonary:     Effort: No respiratory distress.     Breath sounds: No stridor. No wheezing.  Abdominal:     General: Bowel sounds are normal. There is no distension.     Palpations: Abdomen is soft. There is no mass.     Tenderness: There is no abdominal tenderness. There is no guarding or rebound.  Musculoskeletal:        General: No tenderness.     Cervical back: Normal range of motion and neck supple. No rigidity.  Lymphadenopathy:     Cervical: No cervical adenopathy.  Skin:    Findings: No erythema or rash.  Neurological:     Cranial Nerves: No cranial nerve deficit.     Motor: No abnormal muscle tone.     Coordination: Coordination normal.     Deep Tendon Reflexes: Reflexes normal.  Psychiatric:        Behavior: Behavior normal.        Thought Content: Thought content normal.        Judgment: Judgment normal.     Lab Results  Component Value Date   WBC 6.9 01/31/2023   HGB 12.3 01/31/2023   HCT 36.7 01/31/2023   PLT 319.0 01/31/2023   GLUCOSE 86 01/31/2023   CHOL 216 (H) 01/31/2023   TRIG 77.0 01/31/2023   HDL 58.90 01/31/2023   LDLCALC 142 (H) 01/31/2023   ALT 12 01/31/2023   AST 14 01/31/2023   NA 138 01/31/2023   K 4.2 01/31/2023   CL 104 01/31/2023   CREATININE 0.62 01/31/2023   BUN 12 01/31/2023   CO2 27 01/31/2023   TSH 0.79 01/31/2023    No results found.  Assessment & Plan:   Problem List Items Addressed This Visit     Well adult exam - Primary    We discussed age appropriate health related issues, including available/recomended screening tests and vaccinations.  Labs were ordered to be later reviewed . All questions were answered. We discussed one or more of the following - seat belt use, use of sunscreen/sun exposure exercise, safe sex, fall risk reduction, second hand smoke exposure, firearm use and storage, seat belt use, a need for adhering to healthy diet and exercise. Labs were ordered.  All questions were answered.  Labs ordered XRT exposure from [place] (?degree) Vit  D, folic acid       Relevant Orders   TSH   Urinalysis   CBC with Differential/Platelet   Lipid panel   Comprehensive metabolic panel with GFR      No orders of the defined types were placed in this encounter.     Follow-up: Return in about 1 year (around 02/05/2025) for Wellness Exam.  Sonya Barn, MD

## 2024-02-09 ENCOUNTER — Encounter: Payer: Self-pay | Admitting: Internal Medicine

## 2025-02-09 ENCOUNTER — Encounter: Admitting: Internal Medicine
